# Patient Record
Sex: Male | Born: 1945 | Race: Black or African American | Hispanic: No | Marital: Single | Smoking: Current every day smoker
Health system: Southern US, Community
[De-identification: ages and names within clinical notes are randomized; demographics above are authoritative.]

## PROBLEM LIST (undated history)

## (undated) DIAGNOSIS — Z21 Asymptomatic human immunodeficiency virus [HIV] infection status: Secondary | ICD-10-CM

## (undated) DIAGNOSIS — B20 Human immunodeficiency virus [HIV] disease: Secondary | ICD-10-CM

## (undated) DIAGNOSIS — F4310 Post-traumatic stress disorder, unspecified: Secondary | ICD-10-CM

## (undated) DIAGNOSIS — E119 Type 2 diabetes mellitus without complications: Secondary | ICD-10-CM

---

## 2007-10-13 ENCOUNTER — Emergency Department (HOSPITAL_COMMUNITY): Admission: EM | Admit: 2007-10-13 | Discharge: 2007-10-14 | Payer: Self-pay | Admitting: Emergency Medicine

## 2020-11-25 ENCOUNTER — Inpatient Hospital Stay (HOSPITAL_COMMUNITY)
Admission: EM | Admit: 2020-11-25 | Discharge: 2020-12-05 | DRG: 164 | Disposition: A | Discharge status: Home / Self Care | Payer: Medicare PPO | Source: Ambulatory Visit | Attending: Internal Medicine | Admitting: Internal Medicine

## 2020-11-25 ENCOUNTER — Encounter (HOSPITAL_COMMUNITY): Payer: Self-pay | Admitting: Emergency Medicine

## 2020-11-25 ENCOUNTER — Emergency Department (HOSPITAL_COMMUNITY): Payer: Medicare PPO

## 2020-11-25 ENCOUNTER — Other Ambulatory Visit: Payer: Self-pay

## 2020-11-25 ENCOUNTER — Inpatient Hospital Stay (HOSPITAL_COMMUNITY): Payer: Medicare PPO

## 2020-11-25 DIAGNOSIS — Z20822 Contact with and (suspected) exposure to covid-19: Secondary | ICD-10-CM | POA: Diagnosis present

## 2020-11-25 DIAGNOSIS — J948 Other specified pleural conditions: Secondary | ICD-10-CM | POA: Diagnosis present

## 2020-11-25 DIAGNOSIS — Z7983 Long term (current) use of bisphosphonates: Secondary | ICD-10-CM

## 2020-11-25 DIAGNOSIS — G4700 Insomnia, unspecified: Secondary | ICD-10-CM | POA: Diagnosis present

## 2020-11-25 DIAGNOSIS — Z7984 Long term (current) use of oral hypoglycemic drugs: Secondary | ICD-10-CM

## 2020-11-25 DIAGNOSIS — J930 Spontaneous tension pneumothorax: Secondary | ICD-10-CM | POA: Diagnosis present

## 2020-11-25 DIAGNOSIS — J9382 Other air leak: Secondary | ICD-10-CM | POA: Diagnosis not present

## 2020-11-25 DIAGNOSIS — S62502A Fracture of unspecified phalanx of left thumb, initial encounter for closed fracture: Secondary | ICD-10-CM | POA: Diagnosis present

## 2020-11-25 DIAGNOSIS — N400 Enlarged prostate without lower urinary tract symptoms: Secondary | ICD-10-CM | POA: Diagnosis present

## 2020-11-25 DIAGNOSIS — J439 Emphysema, unspecified: Secondary | ICD-10-CM | POA: Diagnosis present

## 2020-11-25 DIAGNOSIS — E119 Type 2 diabetes mellitus without complications: Secondary | ICD-10-CM | POA: Diagnosis present

## 2020-11-25 DIAGNOSIS — J939 Pneumothorax, unspecified: Secondary | ICD-10-CM

## 2020-11-25 DIAGNOSIS — F39 Unspecified mood [affective] disorder: Secondary | ICD-10-CM | POA: Diagnosis present

## 2020-11-25 DIAGNOSIS — Z4682 Encounter for fitting and adjustment of non-vascular catheter: Secondary | ICD-10-CM | POA: Diagnosis not present

## 2020-11-25 DIAGNOSIS — Z7982 Long term (current) use of aspirin: Secondary | ICD-10-CM | POA: Diagnosis not present

## 2020-11-25 DIAGNOSIS — Z79899 Other long term (current) drug therapy: Secondary | ICD-10-CM

## 2020-11-25 DIAGNOSIS — B20 Human immunodeficiency virus [HIV] disease: Secondary | ICD-10-CM | POA: Diagnosis not present

## 2020-11-25 DIAGNOSIS — F17290 Nicotine dependence, other tobacco product, uncomplicated: Secondary | ICD-10-CM | POA: Diagnosis present

## 2020-11-25 DIAGNOSIS — F4310 Post-traumatic stress disorder, unspecified: Secondary | ICD-10-CM | POA: Diagnosis present

## 2020-11-25 DIAGNOSIS — Z9889 Other specified postprocedural states: Secondary | ICD-10-CM

## 2020-11-25 DIAGNOSIS — Z21 Asymptomatic human immunodeficiency virus [HIV] infection status: Secondary | ICD-10-CM | POA: Diagnosis present

## 2020-11-25 DIAGNOSIS — J9383 Other pneumothorax: Secondary | ICD-10-CM

## 2020-11-25 DIAGNOSIS — F17210 Nicotine dependence, cigarettes, uncomplicated: Secondary | ICD-10-CM | POA: Diagnosis present

## 2020-11-25 DIAGNOSIS — E0801 Diabetes mellitus due to underlying condition with hyperosmolarity with coma: Secondary | ICD-10-CM | POA: Diagnosis not present

## 2020-11-25 DIAGNOSIS — K08109 Complete loss of teeth, unspecified cause, unspecified class: Secondary | ICD-10-CM | POA: Diagnosis present

## 2020-11-25 DIAGNOSIS — X58XXXA Exposure to other specified factors, initial encounter: Secondary | ICD-10-CM | POA: Diagnosis present

## 2020-11-25 DIAGNOSIS — E871 Hypo-osmolality and hyponatremia: Secondary | ICD-10-CM | POA: Diagnosis not present

## 2020-11-25 DIAGNOSIS — Z880 Allergy status to penicillin: Secondary | ICD-10-CM

## 2020-11-25 DIAGNOSIS — N179 Acute kidney failure, unspecified: Secondary | ICD-10-CM | POA: Diagnosis not present

## 2020-11-25 DIAGNOSIS — E611 Iron deficiency: Secondary | ICD-10-CM | POA: Diagnosis present

## 2020-11-25 DIAGNOSIS — Z09 Encounter for follow-up examination after completed treatment for conditions other than malignant neoplasm: Secondary | ICD-10-CM

## 2020-11-25 DIAGNOSIS — E860 Dehydration: Secondary | ICD-10-CM | POA: Diagnosis not present

## 2020-11-25 DIAGNOSIS — Z888 Allergy status to other drugs, medicaments and biological substances status: Secondary | ICD-10-CM

## 2020-11-25 DIAGNOSIS — J9311 Primary spontaneous pneumothorax: Secondary | ICD-10-CM | POA: Diagnosis not present

## 2020-11-25 HISTORY — DX: Human immunodeficiency virus (HIV) disease: B20

## 2020-11-25 HISTORY — DX: Type 2 diabetes mellitus without complications: E11.9

## 2020-11-25 HISTORY — DX: Asymptomatic human immunodeficiency virus (hiv) infection status: Z21

## 2020-11-25 HISTORY — DX: Post-traumatic stress disorder, unspecified: F43.10

## 2020-11-25 LAB — CBC
HCT: 42.7 % (ref 39.0–52.0)
Hemoglobin: 14.2 g/dL (ref 13.0–17.0)
MCH: 29 pg (ref 26.0–34.0)
MCHC: 33.3 g/dL (ref 30.0–36.0)
MCV: 87.1 fL (ref 80.0–100.0)
Platelets: 265 10*3/uL (ref 150–400)
RBC: 4.9 MIL/uL (ref 4.22–5.81)
RDW: 15.2 % (ref 11.5–15.5)
WBC: 6.8 10*3/uL (ref 4.0–10.5)
nRBC: 0 % (ref 0.0–0.2)

## 2020-11-25 LAB — BASIC METABOLIC PANEL
Anion gap: 13 (ref 5–15)
BUN: 14 mg/dL (ref 8–23)
CO2: 23 mmol/L (ref 22–32)
Calcium: 9.4 mg/dL (ref 8.9–10.3)
Chloride: 98 mmol/L (ref 98–111)
Creatinine, Ser: 1.39 mg/dL — ABNORMAL HIGH (ref 0.61–1.24)
GFR, Estimated: 53 mL/min — ABNORMAL LOW (ref >60)
Glucose, Bld: 114 mg/dL — ABNORMAL HIGH (ref 70–99)
Potassium: 4.2 mmol/L (ref 3.5–5.1)
Sodium: 134 mmol/L — ABNORMAL LOW (ref 135–145)

## 2020-11-25 LAB — GLUCOSE, CAPILLARY: Glucose-Capillary: 119 mg/dL — ABNORMAL HIGH (ref 70–99)

## 2020-11-25 MED ORDER — ACETAMINOPHEN 650 MG RE SUPP: 650.0000 mg | Freq: Four times a day (QID) | RECTAL | Status: DC | PRN

## 2020-11-25 MED ORDER — INSULIN ASPART 100 UNIT/ML IJ SOLN
0.0000 [IU] | Freq: Three times a day (TID) | INTRAMUSCULAR | Status: DC
  Administered 2020-11-26: 2 [IU] via SUBCUTANEOUS
  Administered 2020-11-27: 1 [IU] via SUBCUTANEOUS
  Administered 2020-11-28 – 2020-11-29 (×2): 2 [IU] via SUBCUTANEOUS
  Administered 2020-11-29: 1 [IU] via SUBCUTANEOUS

## 2020-11-25 MED ORDER — LIDOCAINE HCL (PF) 1 % IJ SOLN
20.0000 mL | Freq: Once | INTRAMUSCULAR | Status: AC
  Administered 2020-11-25: 10 mL
  Filled 2020-11-25: qty 20

## 2020-11-25 MED ORDER — INSULIN ASPART 100 UNIT/ML IJ SOLN: 0.0000 [IU] | Freq: Every day | INTRAMUSCULAR | Status: DC

## 2020-11-25 MED ORDER — FENTANYL CITRATE (PF) 100 MCG/2ML IJ SOLN
100.0000 ug | Freq: Once | INTRAMUSCULAR | Status: AC
  Administered 2020-11-25: 100 ug via INTRAVENOUS
  Filled 2020-11-25: qty 2

## 2020-11-25 MED ORDER — SODIUM CHLORIDE 0.9% FLUSH
10.0000 mL | Freq: Three times a day (TID) | INTRAVENOUS | Status: DC
  Administered 2020-11-26 – 2020-11-29 (×11): 10 mL

## 2020-11-25 MED ORDER — FENTANYL CITRATE (PF) 100 MCG/2ML IJ SOLN
12.5000 ug | INTRAMUSCULAR | Status: DC | PRN
  Administered 2020-11-25 – 2020-11-27 (×5): 50 ug via INTRAVENOUS
  Filled 2020-11-25 (×5): qty 2

## 2020-11-25 MED ORDER — ACETAMINOPHEN 325 MG PO TABS
650.0000 mg | ORAL_TABLET | Freq: Four times a day (QID) | ORAL | Status: DC | PRN
  Administered 2020-11-28: 650 mg via ORAL
  Filled 2020-11-25: qty 2

## 2020-11-25 NOTE — ED Provider Notes (Signed)
Emergency Medicine Provider Triage Evaluation Note  Chris Jennings , a 75 y.o. male  was evaluated in triage.  Pt complains of SOB that began today. Had CXR at urgent care today and was told there was liquid in in right lung concerning for pneumonia. He is from out of town, here for son's wedding. No lower extremity edema or chest pain.  No fevers.  No known COVID exposures.  States he has HIV and is compliant on his medications. No known hx heart failure.  Review of Systems  Positive: SOB Negative: Fever, CP  Physical Exam  BP 133/89 (BP Location: Right Arm)   Pulse (!) 104   Temp 97.6 F (36.4 C) (Oral)   Resp 18   SpO2 97%  Gen:   Awake, no distress   Resp:  Normal effort, speaking in full sentences, lung sounds diminished right lower lung field  MSK:   Moves extremities without difficulty  Other:  No LE edema  Medical Decision Making  Medically screening exam initiated at 4:27 PM.  Appropriate orders placed.  Moises Terpstra was informed that the remainder of the evaluation will be completed by another provider, this initial triage assessment does not replace that evaluation, and the importance of remaining in the ED until their evaluation is complete.    Journee Bobrowski, [place] N, PA-C 11/25/20 1702    Arby Barrette, MD 11/30/20 [phone]

## 2020-11-25 NOTE — ED Provider Notes (Addendum)
[facility] EMERGENCY DEPARTMENT Provider Note   CSN: [id number] Arrival date & time: 11/25/20  1536     History Chief Complaint  Patient presents with   Shortness of Breath   Pneumonia    Chris Jennings is a 75 y.o. male.  Presents to ER with concern for shortness of breath.  Patient reports that his symptoms started yesterday evening.  Has been relatively constant.  Some slight discomfort on the right side of his chest.  States that difficulty breathing has been relatively constant.  States that he feels like he cannot get a deep breath.  Denies any lung history, does have history of smoking.  Denies prior history of COPD.  Denies prior history of pneumothorax.  Denies any recent trauma.  States he went to urgent care and was told that he had an abnormal chest x-ray.  On review of chart, also noted hx of HIV.  HPI     Past Medical History:  Diagnosis Date   Diabetes mellitus without complication (HCC)    HIV (human immunodeficiency virus infection) ([place])    PTSD (post-traumatic stress disorder)     Patient Active Problem List   Diagnosis Date Noted   Tension pneumothorax, spontaneous 11/25/2020   HIV (human immunodeficiency virus infection) ([place]) 11/25/2020   Diabetes ([place]) 11/25/2020    History reviewed. No pertinent surgical history.     Family History  Problem Relation Age of Onset   Cancer Mother     Social History   Tobacco Use   Smoking status: Every Day    Pack years: 0.00    Types: Cigarettes, Cigars   Smokeless tobacco: Never  Substance Use Topics   Alcohol use: Yes    Comment: Occasional   Drug use: Not Currently    Home Medications Prior to Admission medications   Medication Sig Start Date End Date Taking? Authorizing Provider  alendronate (FOSAMAX) 70 MG tablet Take 70 mg by mouth every Sunday. 10/18/20  Yes [provider]  aspirin EC 81 MG tablet Take 81 mg by mouth daily. Swallow whole.   Yes [provider]  Calcium Carb-Cholecalciferol 600-400 MG-UNIT TABS Take 1 tablet by mouth 2 (two) times daily. 05/23/16  Yes [provider]  darunavir-cobicistat (PREZCOBIX) 800-150 MG tablet Take 1 tablet by mouth daily. 10/18/20  Yes [provider]  Dolutegravir-lamiVUDine 50-300 MG TABS Take 1 tablet by mouth daily. 10/18/20  Yes [provider]  ferrous sulfate 325 (65 FE) MG tablet Take 325 mg by mouth in the morning and at bedtime. 05/23/16  Yes [provider]  finasteride (PROSCAR) 5 MG tablet Take 5 mg by mouth at bedtime. 05/23/16  Yes [provider]  ibuprofen (ADVIL) 200 MG tablet Take 600-800 mg by mouth every 6 (six) hours as needed for headache or mild pain.   Yes [provider]  metFORMIN (GLUCOPHAGE) 500 MG tablet Take 500 mg by mouth in the morning and at bedtime. 10/18/20  Yes [provider]  mirtazapine (REMERON) 7.5 MG tablet Take 7.5 mg by mouth at bedtime. 05/30/20  Yes [provider]    Allergies    Niaspan [niacin], Penicillin g, and Zocor [simvastatin]  Review of Systems   Review of Systems  Constitutional:  Negative for chills and fever.  HENT:  Negative for ear pain and sore throat.   Eyes:  Negative for pain and visual disturbance.  Respiratory:  Positive for shortness of breath. Negative for cough.   Cardiovascular:  Positive for chest pain. Negative for palpitations.  Gastrointestinal:  Negative for abdominal pain and vomiting.  Genitourinary:  Negative for dysuria and hematuria.  Musculoskeletal:  Negative for arthralgias and back pain.  Skin:  Negative for color change and rash.  Neurological:  Negative for seizures and syncope.  All other systems reviewed and are negative.  Physical Exam Updated Vital Signs BP 122/77 (BP Location: Left Arm)   Pulse 71   Temp 97.6 F (36.4 C) (Oral)   Resp 16   Ht 5' 10"  (1.778 m)   Wt 58.1 kg   SpO2 98%   BMI 18.37 kg/m   Physical  Exam Vitals and nursing note reviewed.  Constitutional:      Appearance: He is well-developed.  HENT:     Head: Normocephalic and atraumatic.  Eyes:     Conjunctiva/sclera: Conjunctivae normal.  Cardiovascular:     Rate and Rhythm: Normal rate and regular rhythm.     Heart sounds: No murmur heard. Pulmonary:     Comments: Absent breath sounds over right lung fields, some tachypnea but still speaking in full sentences Abdominal:     Palpations: Abdomen is soft.     Tenderness: There is no abdominal tenderness.  Musculoskeletal:     Cervical back: Neck supple.  Skin:    General: Skin is warm and dry.  Neurological:     Mental Status: He is alert.    ED Results / Procedures / Treatments   Labs (all labs ordered are listed, but only abnormal results are displayed) Labs Reviewed  BASIC METABOLIC PANEL - Abnormal; Notable for the following components:      Result Value   Sodium 134 (*)    Glucose, Bld 114 (*)    Creatinine, Ser 1.39 (*)    GFR, Estimated 53 (*)    All other components within normal limits  GLUCOSE, CAPILLARY - Abnormal; Notable for the following components:   Glucose-Capillary 119 (*)    All other components within normal limits  GLUCOSE, CAPILLARY - Abnormal; Notable for the following components:   Glucose-Capillary 100 (*)    All other components within normal limits  GLUCOSE, CAPILLARY - Abnormal; Notable for the following components:   Glucose-Capillary 117 (*)    All other components within normal limits  SARS CORONAVIRUS 2 (TAT 6-24 HRS)  CBC  HEMOGLOBIN A1C    EKG EKG Interpretation  Date/Time:  Saturday November 25 2020 16:07:35 EDT Ventricular Rate:  100 PR Interval:  150 QRS Duration: 68 QT Interval:  320 QTC Calculation: 412 R Axis:   90 Text Interpretation:  Suspect arm lead reversal, interpretation assumes no reversal Normal sinus rhythm Right atrial enlargement Rightward axis Pulmonary disease pattern Abnormal ECG Confirmed by Madalyn Rob [phone]) on 11/26/2020 12:28:40 PM  Radiology DG Chest 2 View  Result Date: 11/25/2020 CLINICAL DATA:  Shortness of breath.  Chest pain. EXAM: CHEST - 2 VIEW COMPARISON:  None. FINDINGS: There is a moderate to large right-sided pneumothorax. The pneumothorax measures at least 8.4 cm at the base and 4.8 cm at the apex. There is fluid in the right pleural space as well consistent with a hydropneumothorax. The relatively collapsed right lung is not otherwise well assessed. There is some shifting of the mediastinum to the left consistent with a tension pneumothora on the right. No pneumothorax on the left. No infiltrate on the left. No other acute abnormalities. Anterior wedging of a 2 lower thoracic vertebral bodies is age indeterminate. IMPRESSION: 1. Right-sided tension  pneumothorax. Fluid in the right pleural space. 2. Age-indeterminate anterior wedging of 2 lower thoracic vertebral bodies. Findings called to Dr. Johnney Killian. Electronically Signed   By: Dorise Bullion III M.D   On: 11/25/2020 17:14   DG Chest Portable 1 View  Result Date: 11/25/2020 CLINICAL DATA:  Status post chest tube placement. EXAM: PORTABLE CHEST 1 VIEW COMPARISON:  November 25, 2020 FINDINGS: A right-sided chest tube is seen with its distal end overlying the lateral aspect of the mid right lung. A small residual right apical pneumothorax is seen. A small right pleural effusion is also noted. Mild right infrahilar atelectasis and/or infiltrate is present. The heart size and mediastinal contours are within normal limits. Multiple chronic left-sided rib fractures are seen. IMPRESSION: Interval right-sided chest tube placement and positioning, as described above, with a small residual right apical pneumothorax. Electronically Signed   By: Virgina Norfolk M.D.   On: 11/25/2020 22:29    Procedures CHEST TUBE INSERTION  Date/Time: 11/26/2020 12:17 PM Performed by: Lucrezia Starch, MD Authorized by: Lucrezia Starch, MD    Consent:    Consent obtained:  Written and verbal   Consent given by:  Patient   Risks, benefits, and alternatives were discussed: yes     Risks discussed:  Bleeding, incomplete drainage, infection, damage to surrounding structures, pain and nerve damage   Alternatives discussed:  No treatment, delayed treatment, alternative treatment, observation and referral Universal protocol:    Procedure explained and questions answered to patient or proxy's satisfaction: yes     Relevant documents present and verified: yes     Test results available: yes     Imaging studies available: yes     Site/side marked: yes     Immediately prior to procedure, a time out was called: yes     Patient identity confirmed:  Verbally with patient and arm band Pre-procedure details:    Skin preparation:  Chlorhexidine   Preparation: Patient was prepped and draped in the usual sterile fashion   Sedation:    Sedation type:  None Anesthesia:    Anesthesia method:  Local infiltration   Local anesthetic:  Lidocaine 1% w/o epi Procedure details:    Placement location:  R lateral   Scalpel size:  11   Tube size ([place]): 14.   Ultrasound guidance: no     Drainage characteristics:  Air only   Suture material:  2-0 silk   Dressing:  Xeroform gauze and 4x4 sterile gauze Post-procedure details:    Post-insertion x-ray findings: tube in good position     Procedure completion:  Tolerated well, no immediate complications Comments:     On right chest wall, site marked at approximately fifth intercostal space at the midaxillary line, nipple line.  Patient in bed at 45 degrees.  Timeout called.  Utilized [place] pneumothorax kit.  Prepped with chlorhexidine.  Applied sterile drape.  Injected 10 mL 1% lidocaine without epinephrine.  Inserted needle at site marking and felt a rush of air in syringe.  Inserted guidewire, removed needle, made small incision with scalpel at level of skin, then inserted and removed dilator over  guidewire, then inserted pigtail catheter over the guidewire.  Subsequently removed guidewire.  Sutured in place.  Applied sterile Xeroform gauze and 4 x 4 sterile gauze.  Apply tape.  Connected chest tube to Pleur-evac atrium, set to -20 cm. .Critical Care  Date/Time: 12/12/2020 12:07 AM Performed by: Lucrezia Starch, MD Authorized by: Lucrezia Starch, MD   Critical  care provider statement:    Critical care time (minutes):  42   Critical care was necessary to treat or prevent imminent or life-threatening deterioration of the following conditions:  Respiratory failure   Critical care was time spent personally by me on the following activities:  Discussions with consultants, evaluation of patient's response to treatment, examination of patient, ordering and performing treatments and interventions, ordering and review of laboratory studies, ordering and review of radiographic studies, pulse oximetry, re-evaluation of patient's condition, obtaining history from patient or surrogate and review of old charts   Medications Ordered in ED Medications  sodium chloride flush (NS) 0.9 % injection 10 mL (10 mLs Intracatheter Given 11/26/20 1018)  acetaminophen (TYLENOL) tablet 650 mg (has no administration in time range)    Or  acetaminophen (TYLENOL) suppository 650 mg (has no administration in time range)  fentaNYL (SUBLIMAZE) injection 12.5-50 mcg (50 mcg Intravenous Given 11/26/20 1023)  insulin aspart (novoLOG) injection 0-9 Units (0 Units Subcutaneous Not Given 11/26/20 1200)  insulin aspart (novoLOG) injection 0-5 Units (0 Units Subcutaneous Not Given 11/26/20 0036)  darunavir-cobicistat (PREZCOBIX) 800-150 MG per tablet 1 tablet (1 tablet Oral Given 11/26/20 1201)  mirtazapine (REMERON) tablet 7.5 mg (has no administration in time range)  finasteride (PROSCAR) tablet 5 mg (has no administration in time range)  ferrous sulfate tablet 325 mg (has no administration in time range)  dolutegravir  (TIVICAY) tablet 50 mg (50 mg Oral Given 11/26/20 1017)    And  lamiVUDine (EPIVIR) tablet 300 mg (300 mg Oral Given 11/26/20 1017)  calcium carbonate (OS-CAL - dosed in mg of elemental calcium) tablet 500 mg of elemental calcium (has no administration in time range)  cholecalciferol (VITAMIN D3) tablet 400 Units (400 Units Oral Given 11/26/20 1017)  lidocaine (PF) (XYLOCAINE) 1 % injection 20 mL (10 mLs Infiltration Given 11/25/20 1842)  fentaNYL (SUBLIMAZE) injection 100 mcg (100 mcg Intravenous Given 11/25/20 1835)    ED Course  I have reviewed the triage vital signs and the nursing notes.  Pertinent labs & imaging results that were available during my care of the patient were reviewed by me and considered in my medical decision making (see chart for details).    MDM Rules/Calculators/A&P                          75 year old male with history of smoking, HIV presents to ER with concern for shortness of breath.  Chest x-ray concerning for large right-sided pneumothorax.  Suspect spontaneous.  His vital signs were stable on room air though noted to have tachypnea, did not actually require any oxygen.  Upon review of chest x-ray, placed on supplemental O2, discussed case with cardiothoracic surgery on-call, Dr. Cyndia Bent.  He recommends pigtail chest tube placement in ER, admission to medicine service, their team will follow along to manage chest tube.  Placed 14 French pigtail catheter.  Patient tolerated very well.  Chest x-ray demonstrating significant improvement in pneumothorax with small residual pneumothorax.  Discussed with Dr. Marlowe Sax who will admit primary.  Final Clinical Impression(s) / ED Diagnoses Final diagnoses:  Spontaneous pneumothorax  Admission for chest tube placement    Rx / DC Orders ED Discharge Orders     None        Lucrezia Starch, MD 11/26/20 1229    Lucrezia Starch, MD 12/12/20 [phone]

## 2020-11-25 NOTE — H&P (Signed)
History and Physical    Chris Jennings UJW:[id number] DOB: 1946/02/14 DOA: 11/25/2020  PCP: [facility] Patient coming from: Home  Chief Complaint: Shortness of breath  HPI: Chris Jennings is a 75 y.o. male with medical history significant of diabetes, HIV, PTSD presented to the ED with a chief complaint of shortness of breath which started today.  In the ED, he was tachypneic but not hypoxic.  Placed on 2 L supplemental oxygen for comfort.  Labs showing WBC 6.8, hemoglobin 14.2, platelet count 265K.  Sodium 134, potassium 4.2, chloride 98, bicarb 23, BUN 14, creatinine 1.3, glucose 114.  SARS-CoV-2 PCR test pending.  Chest x-ray showing moderate to large right-sided tension pneumothorax with fluid in the right pleural space as well consistent with a hydropneumothorax.  Chest tube was placed.  ED physician discussed the case with Dr. Laneta Simmers from cardiothoracic surgery who requested admission under hospitalist service, will consult.  Patient states he is from [place] and currently in town for his grandson's wedding.  Last night at the party he was drinking beer and felt like it went down his windpipe.  Soon after he started feeling short of breath.  Today he went to urgent care and they did a chest x-ray and told him he had fluid in his lung and advised him to come into the emergency room to be evaluated.  States he is feeling much better now after the chest tube was placed.  Denies history of pneumothorax.  He used to smoke cigarettes but has cut down significantly and not smoking much anymore.  States his only other medical problems are HIV and diabetes.  He takes medications for HIV and states his last viral load was undetectable.  States he used to take insulin for diabetes in the past but currently only on metformin.  Denies any other medical problems.  Review of Systems:  All systems reviewed and apart from history of presenting illness, are negative.  Past Medical History:   Diagnosis Date   Diabetes mellitus without complication (HCC)    HIV (human immunodeficiency virus infection) (HCC)    PTSD (post-traumatic stress disorder)     History reviewed. No pertinent surgical history.   reports that he has been smoking cigarettes and cigars. He has never used smokeless tobacco. He reports current alcohol use. He reports previous drug use.  Not on File  Family History  Problem Relation Age of Onset   Cancer Mother     Prior to Admission medications   Not on File    Physical Exam: Vitals:   11/25/20 1733 11/25/20 1745 11/25/20 1830 11/25/20 1900  BP: (!) 165/109 (!) 153/92 (!) 153/94 134/85  Pulse: 93 87 91 91  Resp: (!) 33 (!) 27 (!) 33 (!) 26  Temp:      TempSrc:      SpO2: 96% 95% 96% 96%    Physical Exam Constitutional:      General: He is not in acute distress. HENT:     Head: Normocephalic and atraumatic.  Eyes:     Extraocular Movements: Extraocular movements intact.     Conjunctiva/sclera: Conjunctivae normal.  Cardiovascular:     Rate and Rhythm: Normal rate and regular rhythm.     Pulses: Normal pulses.  Pulmonary:     Effort: Pulmonary effort is normal. No respiratory distress.     Breath sounds: No wheezing or rales.     Comments: Breath sounds appreciated in bilateral lung fields Abdominal:     General: Bowel sounds  are normal. There is no distension.     Palpations: Abdomen is soft.     Tenderness: There is no abdominal tenderness.  Musculoskeletal:        General: No swelling or tenderness.     Cervical back: Normal range of motion and neck supple.  Skin:    General: Skin is warm and dry.  Neurological:     General: No focal deficit present.     Mental Status: He is alert and oriented to person, place, and time.     Labs on Admission: I have personally reviewed following labs and imaging studies  CBC: Recent Labs  Lab 11/25/20 1624  WBC 6.8  HGB 14.2  HCT 42.7  MCV 87.1  PLT 265   Basic Metabolic  Panel: Recent Labs  Lab 11/25/20 1624  NA 134*  K 4.2  CL 98  CO2 23  GLUCOSE 114*  BUN 14  CREATININE 1.39*  CALCIUM 9.4   GFR: CrCl cannot be calculated (Unknown ideal weight.). Liver Function Tests: No results for input(s): AST, ALT, ALKPHOS, BILITOT, PROT, ALBUMIN in the last 168 hours. No results for input(s): LIPASE, AMYLASE in the last 168 hours. No results for input(s): AMMONIA in the last 168 hours. Coagulation Profile: No results for input(s): INR, PROTIME in the last 168 hours. Cardiac Enzymes: No results for input(s): CKTOTAL, CKMB, CKMBINDEX, TROPONINI in the last 168 hours. BNP (last 3 results) No results for input(s): PROBNP in the last 8760 hours. HbA1C: No results for input(s): HGBA1C in the last 72 hours. CBG: No results for input(s): GLUCAP in the last 168 hours. Lipid Profile: No results for input(s): CHOL, HDL, LDLCALC, TRIG, CHOLHDL, LDLDIRECT in the last 72 hours. Thyroid Function Tests: No results for input(s): TSH, T4TOTAL, FREET4, T3FREE, THYROIDAB in the last 72 hours. Anemia Panel: No results for input(s): VITAMINB12, FOLATE, FERRITIN, TIBC, IRON, RETICCTPCT in the last 72 hours. Urine analysis: No results found for: COLORURINE, APPEARANCEUR, LABSPEC, PHURINE, GLUCOSEU, HGBUR, BILIRUBINUR, KETONESUR, PROTEINUR, UROBILINOGEN, NITRITE, LEUKOCYTESUR  Radiological Exams on Admission: DG Chest 2 View  Result Date: 11/25/2020 CLINICAL DATA:  Shortness of breath.  Chest pain. EXAM: CHEST - 2 VIEW COMPARISON:  None. FINDINGS: There is a moderate to large right-sided pneumothorax. The pneumothorax measures at least 8.4 cm at the base and 4.8 cm at the apex. There is fluid in the right pleural space as well consistent with a hydropneumothorax. The relatively collapsed right lung is not otherwise well assessed. There is some shifting of the mediastinum to the left consistent with a tension pneumothora on the right. No pneumothorax on the left. No infiltrate  on the left. No other acute abnormalities. Anterior wedging of a 2 lower thoracic vertebral bodies is age indeterminate. IMPRESSION: 1. Right-sided tension pneumothorax. Fluid in the right pleural space. 2. Age-indeterminate anterior wedging of 2 lower thoracic vertebral bodies. Findings called to Dr. Donnald Garre. Electronically Signed   By: Gerome Sam III M.D   On: 11/25/2020 17:14    EKG: Independently reviewed.  Sinus rhythm, no prior tracing for comparison.  Assessment/Plan Principal Problem:   Tension pneumothorax, spontaneous Active Problems:   HIV (human immunodeficiency virus infection) (HCC)   Diabetes (HCC)   Tension pneumothorax, spontaneous Chest x-ray showing moderate to large right-sided tension pneumothorax with fluid in the right pleural space as well consistent with a hydropneumothorax.  Risk factors include history of cigarette smoking. -Chest tube placed in the ED and currently hemodynamically stable. Cardiothoracic surgery will consult.  Continue pain  management. Continue supplemental oxygen and monitor hemodynamics very closely.  HIV -Resume home meds after pharmacy med rec is done.  Type II diabetes -Check A1c.  Order sliding scale insulin sensitive ACHS.   DVT prophylaxis: SCDs Code Status: Patient wishes to be full code. Family Communication: No family available at this time. Disposition Plan: Status is: Inpatient  Remains inpatient appropriate because:Inpatient level of care appropriate due to severity of illness  Dispo: The patient is from: Home              Anticipated d/c is to: Home              Patient currently is not medically stable to d/c.   Difficult to place patient No  Consults called: Cardiothoracic surgery Level of care: Level of care: Progressive  The medical decision making on this patient was of high complexity and the patient is at high risk for clinical deterioration, therefore this is a level 3 visit.  John Giovanni MD [facility]  If 7PM-7AM, please contact night-coverage [email or web address]  11/25/2020, 8:00 PM

## 2020-11-25 NOTE — ED Notes (Signed)
Lab to add A1c 

## 2020-11-25 NOTE — ED Triage Notes (Addendum)
Pt reports SOB since last night.  States he had a chest x-ray today and was told he had pneumonia.  Denies chest pain.  Pt is from [place].

## 2020-11-26 ENCOUNTER — Inpatient Hospital Stay (HOSPITAL_COMMUNITY): Payer: Medicare PPO

## 2020-11-26 LAB — SARS CORONAVIRUS 2 (TAT 6-24 HRS): SARS Coronavirus 2: NEGATIVE

## 2020-11-26 LAB — GLUCOSE, CAPILLARY
Glucose-Capillary: 100 mg/dL — ABNORMAL HIGH (ref 70–99)
Glucose-Capillary: 117 mg/dL — ABNORMAL HIGH (ref 70–99)
Glucose-Capillary: 171 mg/dL — ABNORMAL HIGH (ref 70–99)
Glucose-Capillary: 117 mg/dL — ABNORMAL HIGH (ref 70–99)

## 2020-11-26 MED ORDER — CALCIUM CARB-CHOLECALCIFEROL 600-400 MG-UNIT PO TABS: 1.0000 | ORAL_TABLET | Freq: Two times a day (BID) | ORAL | Status: DC

## 2020-11-26 MED ORDER — CALCIUM CARBONATE 1250 (500 CA) MG PO TABS: 1.0000 | ORAL_TABLET | Freq: Every day | ORAL | Status: DC

## 2020-11-26 MED ORDER — DOLUTEGRAVIR SODIUM 50 MG PO TABS
50.0000 mg | ORAL_TABLET | Freq: Every day | ORAL | Status: DC
  Administered 2020-11-26 – 2020-11-29 (×4): 50 mg via ORAL
  Filled 2020-11-26 (×5): qty 1

## 2020-11-26 MED ORDER — CHOLECALCIFEROL 10 MCG (400 UNIT) PO TABS
400.0000 [IU] | ORAL_TABLET | Freq: Every day | ORAL | Status: DC
  Administered 2020-11-26 – 2020-11-29 (×4): 400 [IU] via ORAL
  Filled 2020-11-26 (×5): qty 1

## 2020-11-26 MED ORDER — FINASTERIDE 5 MG PO TABS
5.0000 mg | ORAL_TABLET | Freq: Every day | ORAL | Status: DC
  Administered 2020-11-26 – 2020-11-29 (×4): 5 mg via ORAL
  Filled 2020-11-26 (×4): qty 1

## 2020-11-26 MED ORDER — MIRTAZAPINE 15 MG PO TABS
7.5000 mg | ORAL_TABLET | Freq: Every day | ORAL | Status: DC
  Administered 2020-11-26 – 2020-11-29 (×4): 7.5 mg via ORAL
  Filled 2020-11-26 (×4): qty 1

## 2020-11-26 MED ORDER — LAMIVUDINE 150 MG PO TABS
300.0000 mg | ORAL_TABLET | Freq: Every day | ORAL | Status: DC
  Administered 2020-11-26 – 2020-11-29 (×4): 300 mg via ORAL
  Filled 2020-11-26 (×5): qty 2

## 2020-11-26 MED ORDER — FERROUS SULFATE 325 (65 FE) MG PO TABS
325.0000 mg | ORAL_TABLET | Freq: Two times a day (BID) | ORAL | Status: DC
  Administered 2020-11-26 – 2020-11-29 (×7): 325 mg via ORAL
  Filled 2020-11-26 (×7): qty 1

## 2020-11-26 MED ORDER — DARUNAVIR-COBICISTAT 800-150 MG PO TABS
1.0000 | ORAL_TABLET | Freq: Every day | ORAL | Status: DC
  Administered 2020-11-26 – 2020-11-29 (×4): 1 via ORAL
  Filled 2020-11-26 (×5): qty 1

## 2020-11-26 MED ORDER — DOLUTEGRAVIR-LAMIVUDINE 50-300 MG PO TABS: 1.0000 | ORAL_TABLET | Freq: Every day | ORAL | Status: DC

## 2020-11-26 NOTE — Progress Notes (Signed)
PROGRESS NOTE    Chris Jennings   NTZ:[id number]  DOB: 10-07-1945  PCP: [facility]    DOA: 11/25/2020 LOS: 1   Assessment & Plan   Principal Problem:   Tension pneumothorax, spontaneous Active Problems:   HIV (human immunodeficiency virus infection) (HCC)   Diabetes (HCC)   Right-sided Tension pneumothorax / Hydrothorax, spontaneous Chest x-ray showing moderate to large right-sided tension pneumothorax with fluid in the right pleural space as well consistent with a hydropneumothorax.  Risk factors include history of cigarette smoking. --Chest tube placed in the ED  --Pt remains stable --Cardiothoracic surgery consulted on admission --Pain control PRN --Monitor closely --Serial CXR's    HIV - Resumed home meds    Type II diabetes - sliding scale Novolog.  A1c pending.  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   DVT prophylaxis: SCDs Start: 11/25/20 1941   Diet:  Diet Orders (From admission, onward)     Start     Ordered   11/25/20 1942  Diet Carb Modified Fluid consistency: Thin; Room service appropriate? Yes  Diet effective now       Question Answer Comment  Diet-HS Snack? Nothing   Calorie Level Medium 1600-2000   Fluid consistency: Thin   Room service appropriate? Yes      11/25/20 1942              Code Status: Full Code   Brief Narrative / Hospital Course to Date:   Chris Jennings is a 75 y.o. male with medical history significant of diabetes, HIV, PTSD presented to the ED with a chief complaint of sudden onset shortness of breath.  He was found to have right-sided tension PTX / hydrothorax.  Chest tube was placed in the ED.  CTS was consulted, requested [facility] admission and they will consult.    6/26 Patient hemodynamically stable and on room air.  Subjective 11/26/20    Pt reports pain in his right side at  chest tube site.  Has coarse productive cough at times.  No fever/chills.  Denies other acute complaints.  Denies hx of COPD / emphysema.  Patient's fiance in to visit w/patient.   Disposition Plan & Communication   Status is: Inpatient  Remains inpatient appropriate because:Inpatient level of care appropriate due to severity of illness  Dispo: The patient is from: Home              Anticipated d/c is to: Home              Patient currently is not medically stable to d/c.   Difficult to place patient No   Family Communication: fiance at bedside on rounds    Consults, Procedures, Significant Events   Consultants:  CTS  Procedures:  Chest tube placed, right - in ED on admission  Antimicrobials:  Anti-infectives (From admission, onward)    Start     Dose/Rate Route Frequency Ordered Stop   11/26/20 1200  darunavir-cobicistat (PREZCOBIX) 800-150 MG per tablet 1 tablet        1 tablet Oral Daily with lunch 11/26/20 0856     11/26/20 1000  Dolutegravir-lamiVUDine 50-300 MG TABS 1 tablet  Status:  Discontinued        1 tablet Oral Daily 11/26/20 0856 11/26/20 0907   11/26/20 1000  dolutegravir (TIVICAY) tablet 50 mg  See Hyperspace for full Linked Orders Report.   50 mg Oral Daily 11/26/20 0910     11/26/20 1000  lamiVUDine (EPIVIR) tablet 300 mg       See Hyperspace for full Linked Orders Report.   300 mg Oral Daily 11/26/20 0910           Micro    Objective   Vitals:   11/25/20 2154 11/25/20 2200 11/26/20 0729 11/26/20 1153  BP:   121/83 122/77  Pulse:  80 74 71  Resp:   15 16  Temp:   (!) 97.5 F (36.4 C) 97.6 F (36.4 C)  TempSrc:   Oral Oral  SpO2:  98% 96% 98%  Weight: 58.1 kg     Height:        Intake/Output Summary (Last 24 hours) at 11/26/2020 1615 Last data filed at 11/26/2020 1200 Gross per 24 hour  Intake 250 ml  Output 238 ml  Net 12 ml   Filed Weights   11/25/20 2154  Weight: 58.1 kg    Physical Exam:  General exam: awake, alert,  no acute distress Respiratory system: diminished Right, wet-sounding cough intermittently, Left clear, no wheezes, normal respiratory effort, on room air. Cardiovascular system: normal S1/S2, RRR, no pedal edema.   Gastrointestinal system: soft, NT, ND, no HSM felt, +bowel sounds. Central nervous system: A&O x3. no gross focal neurologic deficits, normal speech Extremities: moves all, no edema, normal tone Psychiatry: normal mood, congruent affect, judgement and insight appear normal  Labs   Data Reviewed: I have personally reviewed following labs and imaging studies  CBC: Recent Labs  Lab 11/25/20 1624  WBC 6.8  HGB 14.2  HCT 42.7  MCV 87.1  PLT 265   Basic Metabolic Panel: Recent Labs  Lab 11/25/20 1624  NA 134*  K 4.2  CL 98  CO2 23  GLUCOSE 114*  BUN 14  CREATININE 1.39*  CALCIUM 9.4   GFR: Estimated Creatinine Clearance: 38.3 mL/min (A) (by C-G formula based on SCr of 1.39 mg/dL (H)). Liver Function Tests: No results for input(s): AST, ALT, ALKPHOS, BILITOT, PROT, ALBUMIN in the last 168 hours. No results for input(s): LIPASE, AMYLASE in the last 168 hours. No results for input(s): AMMONIA in the last 168 hours. Coagulation Profile: No results for input(s): INR, PROTIME in the last 168 hours. Cardiac Enzymes: No results for input(s): CKTOTAL, CKMB, CKMBINDEX, TROPONINI in the last 168 hours. BNP (last 3 results) No results for input(s): PROBNP in the last 8760 hours. HbA1C: No results for input(s): HGBA1C in the last 72 hours. CBG: Recent Labs  Lab 11/25/20 2300 11/26/20 0726 11/26/20 1150 11/26/20 1613  GLUCAP 119* 100* 117* 171*   Lipid Profile: No results for input(s): CHOL, HDL, LDLCALC, TRIG, CHOLHDL, LDLDIRECT in the last 72 hours. Thyroid Function Tests: No results for input(s): TSH, T4TOTAL, FREET4, T3FREE, THYROIDAB in the last 72 hours. Anemia Panel: No results for input(s): VITAMINB12, FOLATE, FERRITIN, TIBC, IRON, RETICCTPCT in the last  72 hours. Sepsis Labs: No results for input(s): PROCALCITON, LATICACIDVEN in the last 168 hours.  Recent Results (from the past 240 hour(s))  SARS CORONAVIRUS 2 (TAT 6-24 HRS) Nasopharyngeal Nasopharyngeal Swab     Status: None   Collection Time: 11/25/20  7:06 PM   Specimen: Nasopharyngeal Swab  Result Value Ref Range Status   SARS Coronavirus 2 NEGATIVE NEGATIVE Final    Comment: (NOTE) SARS-CoV-2 target nucleic acids are NOT DETECTED.  The SARS-CoV-2 RNA is generally detectable in upper and  lower respiratory specimens during the acute phase of infection. Negative results do not preclude SARS-CoV-2 infection, do not rule out co-infections with other pathogens, and should not be used as the sole basis for treatment or other patient management decisions. Negative results must be combined with clinical observations, patient history, and epidemiological information. The expected result is Negative.  Fact Sheet for Patients: HairSlick.no  Fact Sheet for Healthcare Providers: quierodirigir.com  This test is not yet approved or cleared by the [place] FDA and  has been authorized for detection and/or diagnosis of SARS-CoV-2 by FDA under an Emergency Use Authorization (EUA). This EUA will remain  in effect (meaning this test can be used) for the duration of the COVID-19 declaration under Se ction 564(b)(1) of the Act, 21 U.S.C. section 360bbb-3(b)(1), unless the authorization is terminated or revoked sooner.  Performed at [facility], [address] [place], [place] [address]       Imaging Studies   DG Chest 2 View  Result Date: 11/25/2020 CLINICAL DATA:  Shortness of breath.  Chest pain. EXAM: CHEST - 2 VIEW COMPARISON:  None. FINDINGS: There is a moderate to large right-sided pneumothorax. The pneumothorax measures at least 8.4 cm at the base and 4.8 cm at the apex. There is fluid in the right pleural space as  well consistent with a hydropneumothorax. The relatively collapsed right lung is not otherwise well assessed. There is some shifting of the mediastinum to the left consistent with a tension pneumothora on the right. No pneumothorax on the left. No infiltrate on the left. No other acute abnormalities. Anterior wedging of a 2 lower thoracic vertebral bodies is age indeterminate. IMPRESSION: 1. Right-sided tension pneumothorax. Fluid in the right pleural space. 2. Age-indeterminate anterior wedging of 2 lower thoracic vertebral bodies. Findings called to Dr. Donnald Garre. Electronically Signed   By: Gerome Sam III M.D   On: 11/25/2020 17:14   DG Chest Portable 1 View  Result Date: 11/25/2020 CLINICAL DATA:  Status post chest tube placement. EXAM: PORTABLE CHEST 1 VIEW COMPARISON:  November 25, 2020 FINDINGS: A right-sided chest tube is seen with its distal end overlying the lateral aspect of the mid right lung. A small residual right apical pneumothorax is seen. A small right pleural effusion is also noted. Mild right infrahilar atelectasis and/or infiltrate is present. The heart size and mediastinal contours are within normal limits. Multiple chronic left-sided rib fractures are seen. IMPRESSION: Interval right-sided chest tube placement and positioning, as described above, with a small residual right apical pneumothorax. Electronically Signed   By: Aram Candela M.D.   On: 11/25/2020 22:29     Medications   Scheduled Meds:  [START ON 11/27/2020] calcium carbonate  1 tablet Oral Q breakfast   cholecalciferol  400 Units Oral Daily   darunavir-cobicistat  1 tablet Oral Q lunch   dolutegravir  50 mg Oral Daily   And   lamiVUDine  300 mg Oral Daily   ferrous sulfate  325 mg Oral BID WC   finasteride  5 mg Oral QHS   insulin aspart  0-5 Units Subcutaneous QHS   insulin aspart  0-9 Units Subcutaneous TID WC   mirtazapine  7.5 mg Oral QHS   sodium chloride flush  10 mL Intracatheter Q8H   Continuous  Infusions:     LOS: 1 day    Time spent: 30 minutes    Pennie Banter, DO [facility]  11/26/2020, 4:15 PM      If 7PM-7AM,  please contact night-coverage. How to contact the [facility] Attending or Consulting provider 7A - 7P or covering provider during after hours 7P -7A, for this patient?    Check the care team in [facility] and look for a) attending/consulting [facility] provider listed and b) the [facility] team listed Log into [email or web address] and use Dewy Rose's universal password to access. If you do not have the password, please contact the hospital operator. Locate the [facility] provider you are looking for under [facility] and page to a number that you can be directly reached. If you still have difficulty reaching the provider, please page the [facility] (Director on Call) for the Hospitalists listed on amion for assistance.

## 2020-11-27 ENCOUNTER — Inpatient Hospital Stay (HOSPITAL_COMMUNITY): Payer: Medicare PPO

## 2020-11-27 DIAGNOSIS — B20 Human immunodeficiency virus [HIV] disease: Secondary | ICD-10-CM

## 2020-11-27 DIAGNOSIS — E119 Type 2 diabetes mellitus without complications: Secondary | ICD-10-CM

## 2020-11-27 DIAGNOSIS — J9311 Primary spontaneous pneumothorax: Secondary | ICD-10-CM

## 2020-11-27 LAB — HEMOGLOBIN A1C
Hgb A1c MFr Bld: 5.7 % — ABNORMAL HIGH (ref 4.8–5.6)
Mean Plasma Glucose: 117 mg/dL

## 2020-11-27 LAB — GLUCOSE, CAPILLARY
Glucose-Capillary: 99 mg/dL (ref 70–99)
Glucose-Capillary: 145 mg/dL — ABNORMAL HIGH (ref 70–99)
Glucose-Capillary: 99 mg/dL (ref 70–99)
Glucose-Capillary: 132 mg/dL — ABNORMAL HIGH (ref 70–99)

## 2020-11-27 MED ORDER — CALCIUM CARBONATE 1250 (500 CA) MG PO TABS
1.0000 | ORAL_TABLET | Freq: Every day | ORAL | Status: DC
  Administered 2020-11-27 – 2020-11-29 (×3): 500 mg via ORAL
  Filled 2020-11-27 (×3): qty 1

## 2020-11-27 NOTE — Progress Notes (Signed)
Chris Jennings  GYB:[id number] DOB: 29-Aug-1945 DOA: 11/25/2020 PCP: [facility]    Brief Narrative:  75 year old with a history of DM, HIV, and PTSD who presented to the ED with the sudden onset of severe shortness of breath.  Evaluation revealed a right-sided tension pneumothorax.  A chest tube was placed in the ED.  Significant Events:  6/25 admit via ED  Consultants:  TCTS  Code Status: FULL CODE  Antimicrobials:  None  DVT prophylaxis: Lovenox  Subjective: Afebrile.  Vital signs stable.  Saturation 96% on 2 L nasal cannula.  States that he is breathing comfortably.  Having expected pain at chest tube insertion site.  Denies fevers chills nausea or vomiting.  No abdominal pain.  Assessment & Plan:  Right spontaneous tension pneumothorax Chest tube placed in ED - TCTS to consult for tube management -appears stable at present  HIV Continue usual home medications  DM2 CBG well controlled at this time  BPH Continue usual Proscar -asymptomatic presently   Family Communication: No family present at time of exam Status is: Inpatient  Remains inpatient appropriate because:Inpatient level of care appropriate due to severity of illness  Dispo: The patient is from: Home              Anticipated d/c is to: Home              Patient currently is not medically stable to d/c.   Difficult to place patient No    Objective: Blood pressure 120/87, pulse 90, temperature 98.5 F (36.9 C), temperature source Oral, resp. rate 18, height 5\' 10"  (1.778 m), weight 58.1 kg, SpO2 95 %.  Intake/Output Summary (Last 24 hours) at 11/27/2020 1030 Last data filed at 11/27/2020 0900 Gross per 24 hour  Intake 920 ml  Output 482 ml  Net 438 ml   Filed Weights   11/25/20 2154  Weight: 58.1 kg    Examination: General: No acute respiratory distress Lungs: Clear to auscultation bilaterally without wheezes or crackles Cardiovascular: Regular rate and rhythm without murmur  gallop or rub normal S1 and S2 Abdomen: Nontender, nondistended, soft, bowel sounds positive, no rebound, no ascites, no appreciable mass Extremities: No significant cyanosis, clubbing, or edema bilateral lower extremities  CBC: Recent Labs  Lab 11/25/20 1624  WBC 6.8  HGB 14.2  HCT 42.7  MCV 87.1  PLT 265   Basic Metabolic Panel: Recent Labs  Lab 11/25/20 1624  NA 134*  K 4.2  CL 98  CO2 23  GLUCOSE 114*  BUN 14  CREATININE 1.39*  CALCIUM 9.4   GFR: Estimated Creatinine Clearance: 38.3 mL/min (A) (by C-G formula based on SCr of 1.39 mg/dL (H)).   CBG: Recent Labs  Lab 11/26/20 0726 11/26/20 1150 11/26/20 1613 11/26/20 2045 11/27/20 0812  GLUCAP 100* 117* 171* 117* 99    Recent Results (from the past 240 hour(s))  SARS CORONAVIRUS 2 (TAT 6-24 HRS) Nasopharyngeal Nasopharyngeal Swab     Status: None   Collection Time: 11/25/20  7:06 PM   Specimen: Nasopharyngeal Swab  Result Value Ref Range Status   SARS Coronavirus 2 NEGATIVE NEGATIVE Final    Comment: (NOTE) SARS-CoV-2 target nucleic acids are NOT DETECTED.  The SARS-CoV-2 RNA is generally detectable in upper and lower respiratory specimens during the acute phase of infection. Negative results do not preclude SARS-CoV-2 infection, do not rule out co-infections with other pathogens, and should not be used as the sole basis for treatment or other patient management decisions. Negative  results must be combined with clinical observations, patient history, and epidemiological information. The expected result is Negative.  Fact Sheet for Patients: HairSlick.no  Fact Sheet for Healthcare Providers: quierodirigir.com  This test is not yet approved or cleared by the [place] FDA and  has been authorized for detection and/or diagnosis of SARS-CoV-2 by FDA under an Emergency Use Authorization (EUA). This EUA will remain  in effect (meaning this test  can be used) for the duration of the COVID-19 declaration under Se ction 564(b)(1) of the Act, 21 U.S.C. section 360bbb-3(b)(1), unless the authorization is terminated or revoked sooner.  Performed at [facility], [address], [place], [place] [address]      Scheduled Meds:  calcium carbonate  1 tablet Oral Q lunch   cholecalciferol  400 Units Oral Daily   darunavir-cobicistat  1 tablet Oral Q lunch   dolutegravir  50 mg Oral Daily   And   lamiVUDine  300 mg Oral Daily   ferrous sulfate  325 mg Oral BID WC   finasteride  5 mg Oral QHS   insulin aspart  0-5 Units Subcutaneous QHS   insulin aspart  0-9 Units Subcutaneous TID WC   mirtazapine  7.5 mg Oral QHS   sodium chloride flush  10 mL Intracatheter Q8H      LOS: 2 days   Lonia Blood, MD [facility] Office  [phone] Pager - Text Page per Loretha Stapler  If 7PM-7AM, please contact night-coverage per Amion 11/27/2020, 10:30 AM

## 2020-11-27 NOTE — Consult Note (Signed)
Reason for Consult:Pneumothorax Referring Physician: [facility]  Chris Jennings is an 75 y.o. male.  HPI: 75 yo man presented with SOB  Chris Jennings is a 75 yo man with a history of tobacco abuse, type II diabetes, HIV and PTSD. He presented to ED yesterday with acute onset of shortness of breath. He had choked on beer the night before and had a coughing spell. Felt short of breath which worsened overnight.  In ED found to have a large right pneumothorax. A pigtail catheter was placed and his breathing improved.  Past Medical History:  Diagnosis Date   Diabetes mellitus without complication (HCC)    HIV (human immunodeficiency virus infection) (HCC)    PTSD (post-traumatic stress disorder)     History reviewed. No pertinent surgical history.  Family History  Problem Relation Age of Onset   Cancer Mother     Social History:  reports that he has been smoking cigarettes and cigars. He has never used smokeless tobacco. He reports current alcohol use. He reports previous drug use.  Allergies:  Allergies  Allergen Reactions   Niaspan [Niacin]     Other reaction(s): Liver enzymes abnormal   Penicillin G Rash   Zocor [Simvastatin] Rash    Other reaction(s): Liver enzymes abnormal    Medications: Scheduled:  calcium carbonate  1 tablet Oral Q lunch   cholecalciferol  400 Units Oral Daily   darunavir-cobicistat  1 tablet Oral Q lunch   dolutegravir  50 mg Oral Daily   And   lamiVUDine  300 mg Oral Daily   ferrous sulfate  325 mg Oral BID WC   finasteride  5 mg Oral QHS   insulin aspart  0-5 Units Subcutaneous QHS   insulin aspart  0-9 Units Subcutaneous TID WC   mirtazapine  7.5 mg Oral QHS   sodium chloride flush  10 mL Intracatheter Q8H    Results for orders placed or performed during the hospital encounter of 11/25/20 (from the past 48 hour(s))  Basic metabolic panel     Status: Abnormal   Collection Time: 11/25/20  4:24 PM  Result Value Ref Range   Sodium  134 (L) 135 - 145 mmol/L   Potassium 4.2 3.5 - 5.1 mmol/L   Chloride 98 98 - 111 mmol/L   CO2 23 22 - 32 mmol/L   Glucose, Bld 114 (H) 70 - 99 mg/dL    Comment: Glucose reference range applies only to samples taken after fasting for at least 8 hours.   BUN 14 8 - 23 mg/dL   Creatinine, Ser 8.92 (H) 0.61 - 1.24 mg/dL   Calcium 9.4 8.9 - 11.9 mg/dL   GFR, Estimated 53 (L) >60 mL/min    Comment: (NOTE) Calculated using the CKD-EPI Creatinine Equation (2021)    Anion gap 13 5 - 15    Comment: Performed at [facility], [address], [place], [place] [address]  CBC     Status: None   Collection Time: 11/25/20  4:24 PM  Result Value Ref Range   WBC 6.8 4.0 - 10.5 K/uL   RBC 4.90 4.22 - 5.81 MIL/uL   Hemoglobin 14.2 13.0 - 17.0 g/dL   HCT 81.4 48.1 - 85.6 %   MCV 87.1 80.0 - 100.0 fL   MCH 29.0 26.0 - 34.0 pg   MCHC 33.3 30.0 - 36.0 g/dL   RDW 31.4 97.0 - 26.3 %   Platelets 265 150 - 400 K/uL   nRBC 0.0 0.0 - 0.2 %  Comment: Performed at [facility], [address] [place], [place] [address]  Hemoglobin A1c     Status: Abnormal   Collection Time: 11/25/20  4:24 PM  Result Value Ref Range   Hgb A1c MFr Bld 5.7 (H) 4.8 - 5.6 %    Comment: (NOTE)         Prediabetes: 5.7 - 6.4         Diabetes: >6.4         Glycemic control for adults with diabetes: <7.0    Mean Plasma Glucose 117 mg/dL    Comment: (NOTE) Performed At: [facility] [address] [place], [place] [id number] Jolene Schimke MD [id number]   SARS CORONAVIRUS 2 (TAT 6-24 HRS) Nasopharyngeal Nasopharyngeal Swab     Status: None   Collection Time: 11/25/20  7:06 PM   Specimen: Nasopharyngeal Swab  Result Value Ref Range   SARS Coronavirus 2 NEGATIVE NEGATIVE    Comment: (NOTE) SARS-CoV-2 target nucleic acids are NOT DETECTED.  The SARS-CoV-2 RNA is generally detectable in upper and lower respiratory specimens during the acute phase of infection. Negative results do not preclude  SARS-CoV-2 infection, do not rule out co-infections with other pathogens, and should not be used as the sole basis for treatment or other patient management decisions. Negative results must be combined with clinical observations, patient history, and epidemiological information. The expected result is Negative.  Fact Sheet for Patients: HairSlick.no  Fact Sheet for Healthcare Providers: quierodirigir.com  This test is not yet approved or cleared by the [place] FDA and  has been authorized for detection and/or diagnosis of SARS-CoV-2 by FDA under an Emergency Use Authorization (EUA). This EUA will remain  in effect (meaning this test can be used) for the duration of the COVID-19 declaration under Se ction 564(b)(1) of the Act, 21 U.S.C. section 360bbb-3(b)(1), unless the authorization is terminated or revoked sooner.  Performed at [facility], [address] [place], [place] [address]   Glucose, capillary     Status: Abnormal   Collection Time: 11/25/20 11:00 PM  Result Value Ref Range   Glucose-Capillary 119 (H) 70 - 99 mg/dL    Comment: Glucose reference range applies only to samples taken after fasting for at least 8 hours.  Glucose, capillary     Status: Abnormal   Collection Time: 11/26/20  7:26 AM  Result Value Ref Range   Glucose-Capillary 100 (H) 70 - 99 mg/dL    Comment: Glucose reference range applies only to samples taken after fasting for at least 8 hours.  Glucose, capillary     Status: Abnormal   Collection Time: 11/26/20 11:50 AM  Result Value Ref Range   Glucose-Capillary 117 (H) 70 - 99 mg/dL    Comment: Glucose reference range applies only to samples taken after fasting for at least 8 hours.  Glucose, capillary     Status: Abnormal   Collection Time: 11/26/20  4:13 PM  Result Value Ref Range   Glucose-Capillary 171 (H) 70 - 99 mg/dL    Comment: Glucose reference range applies only to samples  taken after fasting for at least 8 hours.  Glucose, capillary     Status: Abnormal   Collection Time: 11/26/20  8:45 PM  Result Value Ref Range   Glucose-Capillary 117 (H) 70 - 99 mg/dL    Comment: Glucose reference range applies only to samples taken after fasting for at least 8 hours.  Glucose, capillary     Status: None  Collection Time: 11/27/20  8:12 AM  Result Value Ref Range   Glucose-Capillary 99 70 - 99 mg/dL    Comment: Glucose reference range applies only to samples taken after fasting for at least 8 hours.  Glucose, capillary     Status: Abnormal   Collection Time: 11/27/20 11:58 AM  Result Value Ref Range   Glucose-Capillary 145 (H) 70 - 99 mg/dL    Comment: Glucose reference range applies only to samples taken after fasting for at least 8 hours.    DG Chest 2 View  Result Date: 11/25/2020 CLINICAL DATA:  Shortness of breath.  Chest pain. EXAM: CHEST - 2 VIEW COMPARISON:  None. FINDINGS: There is a moderate to large right-sided pneumothorax. The pneumothorax measures at least 8.4 cm at the base and 4.8 cm at the apex. There is fluid in the right pleural space as well consistent with a hydropneumothorax. The relatively collapsed right lung is not otherwise well assessed. There is some shifting of the mediastinum to the left consistent with a tension pneumothora on the right. No pneumothorax on the left. No infiltrate on the left. No other acute abnormalities. Anterior wedging of a 2 lower thoracic vertebral bodies is age indeterminate. IMPRESSION: 1. Right-sided tension pneumothorax. Fluid in the right pleural space. 2. Age-indeterminate anterior wedging of 2 lower thoracic vertebral bodies. Findings called to Dr. Donnald GarrePfeiffer. Electronically Signed   By: Gerome Samavid  Williams III M.D   On: 11/25/2020 17:14   DG Chest Port 1 View  Result Date: 11/27/2020 CLINICAL DATA:  Shortness of breath. EXAM: PORTABLE CHEST 1 VIEW COMPARISON:  November 26, 2020. FINDINGS: The heart size and mediastinal  contours are within normal limits. Stable position of right-sided chest tube with stable small right apical pneumothorax. Right basilar atelectasis is noted. Left lung is clear. The visualized skeletal structures are unremarkable. IMPRESSION: Stable right-sided chest tube with stable right apical pneumothorax. Electronically Signed   By: Lupita RaiderJames  Green Jr M.D.   On: 11/27/2020 12:16   DG CHEST PORT 1 VIEW  Result Date: 11/26/2020 CLINICAL DATA:  Follow-up right pneumothorax EXAM: PORTABLE CHEST 1 VIEW COMPARISON:  11/25/2020 FINDINGS: Unchanged small, approximately 15% right apical pneumothorax with right-sided pigtail chest tube in position. The left lung is normally aerated. Heart and mediastinum are unremarkable. IMPRESSION: Unchanged small, approximately 15% right apical pneumothorax with right-sided pigtail chest tube in position. Electronically Signed   By: Lauralyn PrimesAlex  Bibbey M.D.   On: 11/26/2020 18:21   DG Chest Portable 1 View  Result Date: 11/25/2020 CLINICAL DATA:  Status post chest tube placement. EXAM: PORTABLE CHEST 1 VIEW COMPARISON:  November 25, 2020 FINDINGS: A right-sided chest tube is seen with its distal end overlying the lateral aspect of the mid right lung. A small residual right apical pneumothorax is seen. A small right pleural effusion is also noted. Mild right infrahilar atelectasis and/or infiltrate is present. The heart size and mediastinal contours are within normal limits. Multiple chronic left-sided rib fractures are seen. IMPRESSION: Interval right-sided chest tube placement and positioning, as described above, with a small residual right apical pneumothorax. Electronically Signed   By: Aram Candelahaddeus  Houston M.D.   On: 11/25/2020 22:29    Review of Systems  Constitutional:  Negative for unexpected weight change.  Respiratory:  Positive for cough and shortness of breath.   Cardiovascular:  Negative for leg swelling.  All other systems reviewed and are negative. Blood pressure 120/87,  pulse 90, temperature 98.5 F (36.9 C), temperature source Oral, resp. rate 18, height  5\' 10"  (1.778 m), weight 58.1 kg, SpO2 95 %. Physical Exam Vitals reviewed.  Constitutional:      General: He is not in acute distress. HENT:     Head: Normocephalic and atraumatic.  Eyes:     General: No scleral icterus.    Extraocular Movements: Extraocular movements intact.  Cardiovascular:     Rate and Rhythm: Normal rate and regular rhythm.     Heart sounds: Normal heart sounds. No murmur heard. Pulmonary:     Effort: No respiratory distress.     Breath sounds: Normal breath sounds. No wheezing or rales.     Comments: Chest tube in place, + air leak with cough Abdominal:     General: There is no distension.     Palpations: Abdomen is soft.  Musculoskeletal:     Cervical back: Neck supple.  Skin:    General: Skin is warm and dry.  Neurological:     General: No focal deficit present.     Mental Status: He is alert and oriented to person, place, and time.     Cranial Nerves: No cranial nerve deficit.    Assessment/Plan: 75 yo man with a history of tobacco abuse, HIV and type II diabetes presented with new onset shortness of breath. CXr showed a large right pneumothorax. A pigtail catheter was placed with good reexpansion of lung.  He currently is on suction. There is a small residual pneumothorax. He has an air leak with cough.  Would leave on suction today.   Will follow  66 11/27/2020, 3:51 PM

## 2020-11-28 ENCOUNTER — Inpatient Hospital Stay (HOSPITAL_COMMUNITY): Payer: Medicare PPO

## 2020-11-28 DIAGNOSIS — J9311 Primary spontaneous pneumothorax: Secondary | ICD-10-CM

## 2020-11-28 LAB — GLUCOSE, CAPILLARY
Glucose-Capillary: 102 mg/dL — ABNORMAL HIGH (ref 70–99)
Glucose-Capillary: 137 mg/dL — ABNORMAL HIGH (ref 70–99)
Glucose-Capillary: 96 mg/dL (ref 70–99)
Glucose-Capillary: 107 mg/dL — ABNORMAL HIGH (ref 70–99)

## 2020-11-28 LAB — BASIC METABOLIC PANEL
Anion gap: 8 (ref 5–15)
BUN: 12 mg/dL (ref 8–23)
CO2: 25 mmol/L (ref 22–32)
Calcium: 9.2 mg/dL (ref 8.9–10.3)
Chloride: 100 mmol/L (ref 98–111)
Creatinine, Ser: 1.23 mg/dL (ref 0.61–1.24)
GFR, Estimated: >60 mL/min (ref >60)
Glucose, Bld: 100 mg/dL — ABNORMAL HIGH (ref 70–99)
Potassium: 4.3 mmol/L (ref 3.5–5.1)
Sodium: 133 mmol/L — ABNORMAL LOW (ref 135–145)

## 2020-11-28 NOTE — Progress Notes (Signed)
PROGRESS NOTE    Chris Jennings  TMH:[id number] DOB: 04-15-1946 DOA: 11/25/2020 PCP: [facility]   Brief Narrative:  75 year old with a history of DM, HIV, and PTSD who presented to the ED with the sudden onset of severe shortness of breath.  Evaluation revealed a right-sided tension pneumothorax.  A chest tube was placed in the ED.  Assessment & Plan:   Principal Problem:   Tension pneumothorax, spontaneous Active Problems:   HIV (human immunodeficiency virus infection) (HCC)   Diabetes (HCC)   Right spontaneous tension pneumothorax Chest tube placed in ED at intake TCTS to consult for tube management - small air leak today - continues on suction   HIV Continue usual home medications   DM2 CBG well controlled at this time   BPH Continue usual Proscar -asymptomatic presently   DVT prophylaxis: Lovenox Code Status: Full Family Communication: None present  Status is: Inpt  Dispo: The patient is from: Home              Anticipated d/c is to: Home              Anticipated d/c date is: 48 to 72 hours              Patient currently not medically stable for discharge  Consultants:  Cardiothoracic Sx  Procedures:  Chest tube placement  Antimicrobials:  None indicated  Subjective: No acute issues or events overnight, chest tube remains in place with airleak, notes ongoing discomfort with chest tube placement but otherwise denies nausea vomiting diarrhea constipation headache fevers chills.  Objective: Vitals:   11/27/20 1632 11/27/20 1859 11/27/20 2112 11/28/20 0500  BP: 121/77  (!) 147/81 125/63  Pulse: 83 87 89 81  Resp: 18   18  Temp: 98.3 F (36.8 C)   98.4 F (36.9 C)  TempSrc: Oral   Oral  SpO2: 99% 99% 100% 98%  Weight:      Height:        Intake/Output Summary (Last 24 hours) at 11/28/2020 0803 Last data filed at 11/28/2020 0500 Gross per 24 hour  Intake 420 ml  Output 230 ml  Net 190 ml   Filed Weights   11/25/20 2154  Weight:  58.1 kg    Examination:  General exam: Appears calm and comfortable  Respiratory system: Clear to auscultation. Respiratory effort normal.  Right-sided chest tube airleak ongoing, to suction Cardiovascular system: S1 & S2 heard, RRR. No JVD, murmurs, rubs, gallops or clicks. No pedal edema. Gastrointestinal system: Abdomen is nondistended, soft and nontender. No organomegaly or masses felt. Normal bowel sounds heard. Central nervous system: Alert and oriented. No focal neurological deficits. Extremities: Symmetric 5 x 5 power. Skin: No rashes, lesions or ulcers Psychiatry: Judgement and insight appear normal. Mood & affect appropriate.     Data Reviewed: I have personally reviewed following labs and imaging studies  CBC: Recent Labs  Lab 11/25/20 1624  WBC 6.8  HGB 14.2  HCT 42.7  MCV 87.1  PLT 265   Basic Metabolic Panel: Recent Labs  Lab 11/25/20 1624 11/28/20 0313  NA 134* 133*  K 4.2 4.3  CL 98 100  CO2 23 25  GLUCOSE 114* 100*  BUN 14 12  CREATININE 1.39* 1.23  CALCIUM 9.4 9.2   GFR: Estimated Creatinine Clearance: 43.3 mL/min (by C-G formula based on SCr of 1.23 mg/dL). Liver Function Tests: No results for input(s): AST, ALT, ALKPHOS, BILITOT, PROT, ALBUMIN in the last 168 hours. No results for input(s):  LIPASE, AMYLASE in the last 168 hours. No results for input(s): AMMONIA in the last 168 hours. Coagulation Profile: No results for input(s): INR, PROTIME in the last 168 hours. Cardiac Enzymes: No results for input(s): CKTOTAL, CKMB, CKMBINDEX, TROPONINI in the last 168 hours. BNP (last 3 results) No results for input(s): PROBNP in the last 8760 hours. HbA1C: Recent Labs    11/25/20 1624  HGBA1C 5.7*   CBG: Recent Labs  Lab 11/26/20 2045 11/27/20 0812 11/27/20 1158 11/27/20 1634 11/27/20 2155  GLUCAP 117* 99 145* 99 132*   Lipid Profile: No results for input(s): CHOL, HDL, LDLCALC, TRIG, CHOLHDL, LDLDIRECT in the last 72 hours. Thyroid  Function Tests: No results for input(s): TSH, T4TOTAL, FREET4, T3FREE, THYROIDAB in the last 72 hours. Anemia Panel: No results for input(s): VITAMINB12, FOLATE, FERRITIN, TIBC, IRON, RETICCTPCT in the last 72 hours. Sepsis Labs: No results for input(s): PROCALCITON, LATICACIDVEN in the last 168 hours.  Recent Results (from the past 240 hour(s))  SARS CORONAVIRUS 2 (TAT 6-24 HRS) Nasopharyngeal Nasopharyngeal Swab     Status: None   Collection Time: 11/25/20  7:06 PM   Specimen: Nasopharyngeal Swab  Result Value Ref Range Status   SARS Coronavirus 2 NEGATIVE NEGATIVE Final    Comment: (NOTE) SARS-CoV-2 target nucleic acids are NOT DETECTED.  The SARS-CoV-2 RNA is generally detectable in upper and lower respiratory specimens during the acute phase of infection. Negative results do not preclude SARS-CoV-2 infection, do not rule out co-infections with other pathogens, and should not be used as the sole basis for treatment or other patient management decisions. Negative results must be combined with clinical observations, patient history, and epidemiological information. The expected result is Negative.  Fact Sheet for Patients: HairSlick.no  Fact Sheet for Healthcare Providers: quierodirigir.com  This test is not yet approved or cleared by the [place] FDA and  has been authorized for detection and/or diagnosis of SARS-CoV-2 by FDA under an Emergency Use Authorization (EUA). This EUA will remain  in effect (meaning this test can be used) for the duration of the COVID-19 declaration under Se ction 564(b)(1) of the Act, 21 U.S.C. section 360bbb-3(b)(1), unless the authorization is terminated or revoked sooner.  Performed at [facility], [address] [place], [place] [address]          Radiology Studies: DG Chest Port 1 View  Result Date: 11/28/2020 CLINICAL DATA:  Pneumothorax.  Chest tube. EXAM:  PORTABLE CHEST 1 VIEW COMPARISON:  11/27/2020. FINDINGS: Right chest tube in stable position. Stable right apical pneumothorax. Heart size stable. Mild right base atelectasis again noted. Old left rib fractures again noted. No acute bony abnormality identified. IMPRESSION: Right chest tube in stable position. Stable right apical pneumothorax. 2.  Mild right base atelectasis again noted. Electronically Signed   By: Maisie Fus  Register   On: 11/28/2020 06:33   DG Chest Port 1 View  Result Date: 11/27/2020 CLINICAL DATA:  Shortness of breath. EXAM: PORTABLE CHEST 1 VIEW COMPARISON:  November 26, 2020. FINDINGS: The heart size and mediastinal contours are within normal limits. Stable position of right-sided chest tube with stable small right apical pneumothorax. Right basilar atelectasis is noted. Left lung is clear. The visualized skeletal structures are unremarkable. IMPRESSION: Stable right-sided chest tube with stable right apical pneumothorax. Electronically Signed   By: Lupita Raider M.D.   On: 11/27/2020 12:16   DG CHEST PORT 1 VIEW  Result Date: 11/26/2020 CLINICAL DATA:  Follow-up right pneumothorax EXAM: PORTABLE CHEST  1 VIEW COMPARISON:  11/25/2020 FINDINGS: Unchanged small, approximately 15% right apical pneumothorax with right-sided pigtail chest tube in position. The left lung is normally aerated. Heart and mediastinum are unremarkable. IMPRESSION: Unchanged small, approximately 15% right apical pneumothorax with right-sided pigtail chest tube in position. Electronically Signed   By: Lauralyn Primes M.D.   On: 11/26/2020 18:21    Scheduled Meds:  calcium carbonate  1 tablet Oral Q lunch   cholecalciferol  400 Units Oral Daily   darunavir-cobicistat  1 tablet Oral Q lunch   dolutegravir  50 mg Oral Daily   And   lamiVUDine  300 mg Oral Daily   ferrous sulfate  325 mg Oral BID WC   finasteride  5 mg Oral QHS   insulin aspart  0-5 Units Subcutaneous QHS   insulin aspart  0-9 Units Subcutaneous TID  WC   mirtazapine  7.5 mg Oral QHS   sodium chloride flush  10 mL Intracatheter Q8H    LOS: 3 days   Time spent:  Azucena Fallen, DO [facility]  If 7PM-7AM, please contact night-coverage [email or web address]  11/28/2020, 8:03 AM

## 2020-11-28 NOTE — Progress Notes (Signed)
  Subjective: No complaints this AM, denies pain  Objective: Vital signs in last 24 hours: Temp:  [98.3 F (36.8 C)-98.5 F (36.9 C)] 98.4 F (36.9 C) (06/28 0500) Pulse Rate:  [81-90] 81 (06/28 0500) Cardiac Rhythm: Normal sinus rhythm (06/27 1908) Resp:  [18] 18 (06/28 0500) BP: (120-147)/(63-87) 125/63 (06/28 0500) SpO2:  [95 %-100 %] 98 % (06/28 0500)  Hemodynamic parameters for last 24 hours:    Intake/Output from previous day: 06/27 0701 - 06/28 0700 In: 420 [P.O.:400] Out: 290 [Urine:200; Chest Tube:90] Intake/Output this shift: No intake/output data recorded.  General appearance: alert, cooperative, and no distress Lungs: clear to auscultation bilaterally + air leak with cough  Lab Results: Recent Labs    11/25/20 1624  WBC 6.8  HGB 14.2  HCT 42.7  PLT 265   BMET:  Recent Labs    11/25/20 1624 11/28/20 0313  NA 134* 133*  K 4.2 4.3  CL 98 100  CO2 23 25  GLUCOSE 114* 100*  BUN 14 12  CREATININE 1.39* 1.23  CALCIUM 9.4 9.2    PT/INR: No results for input(s): LABPROT, INR in the last 72 hours. ABG No results found for: PHART, HCO3, TCO2, ACIDBASEDEF, O2SAT CBG (last 3)  Recent Labs    11/27/20 1158 11/27/20 1634 11/27/20 2155  GLUCAP 145* 99 132*    Assessment/Plan:  Spontaneous pneumothorax CXR relatively stable + air leak with cough, but decreased from yesterday- will keep on suction today Ambulate    LOS: 3 days    Loreli Slot 11/28/2020

## 2020-11-28 NOTE — Care Management Important Message (Signed)
Important Message  Patient Details  Name: Chris Jennings MRN: [id number] Date of Birth: 23-Aug-1945   Medicare Important Message Given:  Yes     Dorena Bodo 11/28/2020, 2:58 PM

## 2020-11-29 ENCOUNTER — Inpatient Hospital Stay (HOSPITAL_COMMUNITY): Payer: Medicare PPO

## 2020-11-29 DIAGNOSIS — E0801 Diabetes mellitus due to underlying condition with hyperosmolarity with coma: Secondary | ICD-10-CM

## 2020-11-29 DIAGNOSIS — J9311 Primary spontaneous pneumothorax: Secondary | ICD-10-CM

## 2020-11-29 DIAGNOSIS — J9383 Other pneumothorax: Secondary | ICD-10-CM

## 2020-11-29 LAB — BASIC METABOLIC PANEL
Anion gap: 6 (ref 5–15)
BUN: 14 mg/dL (ref 8–23)
CO2: 26 mmol/L (ref 22–32)
Calcium: 9.3 mg/dL (ref 8.9–10.3)
Chloride: 100 mmol/L (ref 98–111)
Creatinine, Ser: 1.08 mg/dL (ref 0.61–1.24)
GFR, Estimated: >60 mL/min (ref >60)
Glucose, Bld: 151 mg/dL — ABNORMAL HIGH (ref 70–99)
Potassium: 3.8 mmol/L (ref 3.5–5.1)
Sodium: 132 mmol/L — ABNORMAL LOW (ref 135–145)

## 2020-11-29 LAB — CBC
HCT: 35.8 % — ABNORMAL LOW (ref 39.0–52.0)
Hemoglobin: 12.4 g/dL — ABNORMAL LOW (ref 13.0–17.0)
MCH: 29.8 pg (ref 26.0–34.0)
MCHC: 34.6 g/dL (ref 30.0–36.0)
MCV: 86.1 fL (ref 80.0–100.0)
Platelets: 155 10*3/uL (ref 150–400)
RBC: 4.16 MIL/uL — ABNORMAL LOW (ref 4.22–5.81)
RDW: 14.6 % (ref 11.5–15.5)
WBC: 4.6 10*3/uL (ref 4.0–10.5)
nRBC: 0 % (ref 0.0–0.2)

## 2020-11-29 LAB — GLUCOSE, CAPILLARY
Glucose-Capillary: 137 mg/dL — ABNORMAL HIGH (ref 70–99)
Glucose-Capillary: 178 mg/dL — ABNORMAL HIGH (ref 70–99)
Glucose-Capillary: 106 mg/dL — ABNORMAL HIGH (ref 70–99)
Glucose-Capillary: 191 mg/dL — ABNORMAL HIGH (ref 70–99)

## 2020-11-29 NOTE — Progress Notes (Signed)
Spoke with Dr Daleen Squibb, Patient noted sleeping at this time, O2 sat 91-93% ,may titrate O2  if needed to keep sat>92%  per above MD, For repeat CXR in AM ( already on order). Continue to monitor patient.

## 2020-11-29 NOTE — Progress Notes (Signed)
Patient c/o SOB "feeling tight " when trying to take deep breath O2 sat 94% on O2 2l/m [place], R Chest tube intact to water seal. Ocassional non productive cough noted. Vital signs  stable (see flow sheet)Dr Wall notified.

## 2020-11-29 NOTE — Progress Notes (Signed)
PROGRESS NOTE    Chris Jennings  GYI:[id number] DOB: 1945-11-07 DOA: 11/25/2020 PCP: [facility]    Brief Narrative:  75 year old with a history of DM, HIV, and PTSD who presented to the ED with the sudden onset of severe shortness of breath.  Evaluation revealed a right-sided tension pneumothorax.  A chest tube was placed in the ED.  Assessment & Plan:   Principal Problem:   Tension pneumothorax, spontaneous Active Problems:   HIV (human immunodeficiency virus infection) (HCC)   Diabetes (HCC)  Right spontaneous tension pneumothorax Chest tube placed in ED at time of presentation CT surgery following. Pt continues on suction   HIV Continue usual home medications   DM2 CBG trends reviewed Stable   BPH Continue usual Proscar  Stable at this time    DVT prophylaxis: SCD's Code Status: Full Family Communication: Pt in room, family not at bedside  Status is: Inpatient  Remains inpatient appropriate because:Inpatient level of care appropriate due to severity of illness  Dispo: The patient is from: Home              Anticipated d/c is to: Home              Patient currently is not medically stable to d/c.   Difficult to place patient No       Consultants:  CT Surgery  Procedures:    Antimicrobials: Anti-infectives (From admission, onward)    Start     Dose/Rate Route Frequency Ordered Stop   11/26/20 1200  darunavir-cobicistat (PREZCOBIX) 800-150 MG per tablet 1 tablet        1 tablet Oral Daily with lunch 11/26/20 0856     11/26/20 1000  Dolutegravir-lamiVUDine 50-300 MG TABS 1 tablet  Status:  Discontinued        1 tablet Oral Daily 11/26/20 0856 11/26/20 0907   11/26/20 1000  dolutegravir (TIVICAY) tablet 50 mg       See Hyperspace for full Linked Orders Report.   50 mg Oral Daily 11/26/20 0910     11/26/20 1000  lamiVUDine (EPIVIR) tablet 300 mg       See Hyperspace for full Linked Orders Report.   300 mg Oral Daily 11/26/20 0910          Subjective: Without complaints. Reports ambulating in hallway overnight  Objective: Vitals:   11/28/20 1439 11/28/20 2026 11/29/20 0524 11/29/20 0905  BP: 135/84 113/79 119/77 110/68  Pulse: 78 71 70 91  Resp: 18 18 18 18   Temp: 98.1 F (36.7 C) 97.9 F (36.6 C) 98.6 F (37 C) 98.5 F (36.9 C)  TempSrc: Oral Oral Oral Oral  SpO2: 97% 98% 96% 94%  Weight:      Height:        Intake/Output Summary (Last 24 hours) at 11/29/2020 1637 Last data filed at 11/29/2020 12/01/2020 Gross per 24 hour  Intake 260 ml  Output 510 ml  Net -250 ml   Filed Weights   11/25/20 2154  Weight: 58.1 kg    Examination: General exam: Awake, laying in bed, in nad Respiratory system: Normal respiratory effort, no wheezing, chest pain in place Cardiovascular system: regular rate, s1, s2 Gastrointestinal system: Soft, nondistended, positive BS Central nervous system: CN2-12 grossly intact, strength intact Extremities: Perfused, no clubbing Skin: Normal skin turgor, no notable skin lesions seen Psychiatry: Mood normal // no visual hallucinations   Data Reviewed: I have personally reviewed following labs and imaging studies  CBC: Recent Labs  Lab 11/25/20 1624  11/29/20 0302  WBC 6.8 4.6  HGB 14.2 12.4*  HCT 42.7 35.8*  MCV 87.1 86.1  PLT 265 155   Basic Metabolic Panel: Recent Labs  Lab 11/25/20 1624 11/28/20 0313 11/29/20 0302  NA 134* 133* 132*  K 4.2 4.3 3.8  CL 98 100 100  CO2 23 25 26   GLUCOSE 114* 100* 151*  BUN 14 12 14   CREATININE 1.39* 1.23 1.08  CALCIUM 9.4 9.2 9.3   GFR: Estimated Creatinine Clearance: 49.3 mL/min (by C-G formula based on SCr of 1.08 mg/dL). Liver Function Tests: No results for input(s): AST, ALT, ALKPHOS, BILITOT, PROT, ALBUMIN in the last 168 hours. No results for input(s): LIPASE, AMYLASE in the last 168 hours. No results for input(s): AMMONIA in the last 168 hours. Coagulation Profile: No results for input(s): INR, PROTIME in the last 168  hours. Cardiac Enzymes: No results for input(s): CKTOTAL, CKMB, CKMBINDEX, TROPONINI in the last 168 hours. BNP (last 3 results) No results for input(s): PROBNP in the last 8760 hours. HbA1C: No results for input(s): HGBA1C in the last 72 hours. CBG: Recent Labs  Lab 11/28/20 1123 11/28/20 1611 11/28/20 2239 11/29/20 0746 11/29/20 1201  GLUCAP 137* 96 107* 137* 178*   Lipid Profile: No results for input(s): CHOL, HDL, LDLCALC, TRIG, CHOLHDL, LDLDIRECT in the last 72 hours. Thyroid Function Tests: No results for input(s): TSH, T4TOTAL, FREET4, T3FREE, THYROIDAB in the last 72 hours. Anemia Panel: No results for input(s): VITAMINB12, FOLATE, FERRITIN, TIBC, IRON, RETICCTPCT in the last 72 hours. Sepsis Labs: No results for input(s): PROCALCITON, LATICACIDVEN in the last 168 hours.  Recent Results (from the past 240 hour(s))  SARS CORONAVIRUS 2 (TAT 6-24 HRS) Nasopharyngeal Nasopharyngeal Swab     Status: None   Collection Time: 11/25/20  7:06 PM   Specimen: Nasopharyngeal Swab  Result Value Ref Range Status   SARS Coronavirus 2 NEGATIVE NEGATIVE Final    Comment: (NOTE) SARS-CoV-2 target nucleic acids are NOT DETECTED.  The SARS-CoV-2 RNA is generally detectable in upper and lower respiratory specimens during the acute phase of infection. Negative results do not preclude SARS-CoV-2 infection, do not rule out co-infections with other pathogens, and should not be used as the sole basis for treatment or other patient management decisions. Negative results must be combined with clinical observations, patient history, and epidemiological information. The expected result is Negative.  Fact Sheet for Patients: 12/01/20  Fact Sheet for Healthcare Providers: 11/27/20  This test is not yet approved or cleared by the [employer] FDA and  has been authorized for detection and/or diagnosis of SARS-CoV-2 by FDA  under an Emergency Use Authorization (EUA). This EUA will remain  in effect (meaning this test can be used) for the duration of the COVID-19 declaration under Se ction 564(b)(1) of the Act, 21 U.S.C. section 360bbb-3(b)(1), unless the authorization is terminated or revoked sooner.  Performed at [facility], [address], [place], [address] [place]      Radiology Studies: DG Chest Port 1 View  Result Date: 11/29/2020 CLINICAL DATA:  Pneumothorax.  Chest tube. EXAM: PORTABLE CHEST 1 VIEW COMPARISON:  11/28/2020. FINDINGS: Right chest tube in stable position. Stable right apical pneumothorax. A small right basilar component also noted. No focal infiltrate. Heart size stable. Old left rib fractures again noted. IMPRESSION: Right chest tube in stable position. Stable small right apical pneumothorax. Small right basilar pneumothorax component also noted. Electronically Signed   By: 12/01/2020  Register   On: 11/29/2020 05:36  DG Chest Port 1 View  Result Date: 11/28/2020 CLINICAL DATA:  Pneumothorax.  Chest tube. EXAM: PORTABLE CHEST 1 VIEW COMPARISON:  11/27/2020. FINDINGS: Right chest tube in stable position. Stable right apical pneumothorax. Heart size stable. Mild right base atelectasis again noted. Old left rib fractures again noted. No acute bony abnormality identified. IMPRESSION: Right chest tube in stable position. Stable right apical pneumothorax. 2.  Mild right base atelectasis again noted. Electronically Signed   By: Maisie Fus  Register   On: 11/28/2020 06:33    Scheduled Meds:  calcium carbonate  1 tablet Oral Q lunch   cholecalciferol  400 Units Oral Daily   darunavir-cobicistat  1 tablet Oral Q lunch   dolutegravir  50 mg Oral Daily   And   lamiVUDine  300 mg Oral Daily   ferrous sulfate  325 mg Oral BID WC   finasteride  5 mg Oral QHS   insulin aspart  0-5 Units Subcutaneous QHS   insulin aspart  0-9 Units Subcutaneous TID WC   mirtazapine  7.5 mg Oral QHS   sodium  chloride flush  10 mL Intracatheter Q8H   Continuous Infusions:   LOS: 4 days   Rickey Barbara, MD [facility] Pager On Amion  If 7PM-7AM, please contact night-coverage 11/29/2020, 4:37 PM

## 2020-11-29 NOTE — Progress Notes (Addendum)
      [address]       [name] [address]             [phone]    Subjective:  Patient has no complaints.  Overall hanging in there  Objective: Vital signs in last 24 hours: Temp:  [97.9 F (36.6 C)-98.6 F (37 C)] 98.6 F (37 C) (06/29 0524) Pulse Rate:  [70-79] 70 (06/29 0524) Cardiac Rhythm: Normal sinus rhythm (06/28 1907) Resp:  [16-18] 18 (06/29 0524) BP: (113-135)/(77-84) 119/77 (06/29 0524) SpO2:  [96 %-98 %] 96 % (06/29 0524)  Intake/Output from previous day: 06/28 0701 - 06/29 0700 In: 20  Out: 510 [Urine:480; Chest Tube:30]  General appearance: alert, cooperative, and no distress Heart: regular rate and rhythm Lungs: clear to auscultation bilaterally Wound: clean and dry  Lab Results: Recent Labs    11/29/20 0302  WBC 4.6  HGB 12.4*  HCT 35.8*  PLT 155   BMET:  Recent Labs    11/28/20 0313 11/29/20 0302  NA 133* 132*  K 4.3 3.8  CL 100 100  CO2 25 26  GLUCOSE 100* 151*  BUN 12 14  CREATININE 1.23 1.08  CALCIUM 9.2 9.3    PT/INR: No results for input(s): LABPROT, INR in the last 72 hours. ABG No results found for: PHART, HCO3, TCO2, ACIDBASEDEF, O2SAT CBG (last 3)  Recent Labs    11/28/20 1611 11/28/20 2239 11/29/20 0746  GLUCAP 96 107* 137*    Assessment/Plan:  Spontaneous Pneumothorax, right-CT in place, + air leak with cough on suction.. CXR with stable apical and basilar component... ? Continue suction today vs trial on water seal, repeat CXR in AM   LOS: 4 days    Lowella Dandy, PA-C 11/29/2020  Patient seen and examined, agree with above CXR about the same to minimally improved, still has air leak with cough Will try on water seal. May end up needing surgery  Viviann Spare C. Dorris Fetch, MD [facility] [phone]

## 2020-11-30 ENCOUNTER — Inpatient Hospital Stay (HOSPITAL_COMMUNITY): Payer: Medicare PPO | Admitting: Certified Registered"

## 2020-11-30 ENCOUNTER — Inpatient Hospital Stay (HOSPITAL_COMMUNITY): Payer: Medicare PPO

## 2020-11-30 ENCOUNTER — Encounter (HOSPITAL_COMMUNITY): Payer: Self-pay | Admitting: Internal Medicine

## 2020-11-30 ENCOUNTER — Encounter (HOSPITAL_COMMUNITY)
Admission: EM | Disposition: A | Discharge status: Home / Self Care | Payer: Self-pay | Source: Ambulatory Visit | Attending: Internal Medicine

## 2020-11-30 DIAGNOSIS — J9311 Primary spontaneous pneumothorax: Secondary | ICD-10-CM

## 2020-11-30 HISTORY — PX: INTERCOSTAL NERVE BLOCK: SHX5021

## 2020-11-30 HISTORY — PX: XI ROBOTIC ASSISTED THORACOSCOPY PLEURECTOMY: SHX6888

## 2020-11-30 LAB — BASIC METABOLIC PANEL
Anion gap: 9 (ref 5–15)
BUN: 12 mg/dL (ref 8–23)
CO2: 23 mmol/L (ref 22–32)
Calcium: 9.3 mg/dL (ref 8.9–10.3)
Chloride: 99 mmol/L (ref 98–111)
Creatinine, Ser: 1.02 mg/dL (ref 0.61–1.24)
GFR, Estimated: >60 mL/min (ref >60)
Glucose, Bld: 108 mg/dL — ABNORMAL HIGH (ref 70–99)
Potassium: 3.8 mmol/L (ref 3.5–5.1)
Sodium: 131 mmol/L — ABNORMAL LOW (ref 135–145)

## 2020-11-30 LAB — TYPE AND SCREEN
ABO/RH(D): O POS
Antibody Screen: NEGATIVE

## 2020-11-30 LAB — POCT I-STAT 7, (LYTES, BLD GAS, ICA,H+H)
Acid-Base Excess: 0 mmol/L (ref 0.0–2.0)
Acid-base deficit: 2 mmol/L (ref 0.0–2.0)
Bicarbonate: 24.5 mmol/L (ref 20.0–28.0)
Bicarbonate: 24.7 mmol/L (ref 20.0–28.0)
Calcium, Ion: 1.19 mmol/L (ref 1.15–1.40)
Calcium, Ion: 1.2 mmol/L (ref 1.15–1.40)
HCT: 34 % — ABNORMAL LOW (ref 39.0–52.0)
HCT: 34 % — ABNORMAL LOW (ref 39.0–52.0)
Hemoglobin: 11.6 g/dL — ABNORMAL LOW (ref 13.0–17.0)
Hemoglobin: 11.6 g/dL — ABNORMAL LOW (ref 13.0–17.0)
O2 Saturation: 98 %
O2 Saturation: 100 %
Patient temperature: 35.8
Patient temperature: 35.3
Potassium: 3.7 mmol/L (ref 3.5–5.1)
Potassium: 4.1 mmol/L (ref 3.5–5.1)
Sodium: 135 mmol/L (ref 135–145)
Sodium: 135 mmol/L (ref 135–145)
TCO2: 26 mmol/L (ref 22–32)
TCO2: 26 mmol/L (ref 22–32)
pCO2 arterial: 46.7 mmHg (ref 32.0–48.0)
pCO2 arterial: 38.4 mmHg (ref 32.0–48.0)
pH, Arterial: 7.322 — ABNORMAL LOW (ref 7.350–7.450)
pH, Arterial: 7.408 (ref 7.350–7.450)
pO2, Arterial: 107 mmHg (ref 83.0–108.0)
pO2, Arterial: 162 mmHg — ABNORMAL HIGH (ref 83.0–108.0)

## 2020-11-30 LAB — BLOOD GAS, ARTERIAL
Acid-Base Excess: 0.5 mmol/L (ref 0.0–2.0)
Bicarbonate: 24.4 mmol/L (ref 20.0–28.0)
Drawn by: 51185
FIO2: 21
O2 Saturation: 93 %
Patient temperature: 37
pCO2 arterial: 38.5 mmHg (ref 32.0–48.0)
pH, Arterial: 7.419 (ref 7.350–7.450)
pO2, Arterial: 70.5 mmHg — ABNORMAL LOW (ref 83.0–108.0)

## 2020-11-30 LAB — COMPREHENSIVE METABOLIC PANEL
ALT: 16 U/L (ref 0–44)
AST: 24 U/L (ref 15–41)
Albumin: 3 g/dL — ABNORMAL LOW (ref 3.5–5.0)
Alkaline Phosphatase: 51 U/L (ref 38–126)
Anion gap: 10 (ref 5–15)
BUN: 11 mg/dL (ref 8–23)
CO2: 20 mmol/L — ABNORMAL LOW (ref 22–32)
Calcium: 9.1 mg/dL (ref 8.9–10.3)
Chloride: 103 mmol/L (ref 98–111)
Creatinine, Ser: 0.97 mg/dL (ref 0.61–1.24)
GFR, Estimated: >60 mL/min (ref >60)
Glucose, Bld: 126 mg/dL — ABNORMAL HIGH (ref 70–99)
Potassium: 4.2 mmol/L (ref 3.5–5.1)
Sodium: 133 mmol/L — ABNORMAL LOW (ref 135–145)
Total Bilirubin: 0.9 mg/dL (ref 0.3–1.2)
Total Protein: 6.9 g/dL (ref 6.5–8.1)

## 2020-11-30 LAB — CBC
HCT: 35.8 % — ABNORMAL LOW (ref 39.0–52.0)
Hemoglobin: 12.6 g/dL — ABNORMAL LOW (ref 13.0–17.0)
MCH: 29.8 pg (ref 26.0–34.0)
MCHC: 35.2 g/dL (ref 30.0–36.0)
MCV: 84.6 fL (ref 80.0–100.0)
Platelets: 189 10*3/uL (ref 150–400)
RBC: 4.23 MIL/uL (ref 4.22–5.81)
RDW: 14.3 % (ref 11.5–15.5)
WBC: 4.9 10*3/uL (ref 4.0–10.5)
nRBC: 0 % (ref 0.0–0.2)

## 2020-11-30 LAB — GLUCOSE, CAPILLARY
Glucose-Capillary: 132 mg/dL — ABNORMAL HIGH (ref 70–99)
Glucose-Capillary: 142 mg/dL — ABNORMAL HIGH (ref 70–99)
Glucose-Capillary: 138 mg/dL — ABNORMAL HIGH (ref 70–99)
Glucose-Capillary: 148 mg/dL — ABNORMAL HIGH (ref 70–99)

## 2020-11-30 LAB — SURGICAL PCR SCREEN
MRSA, PCR: NEGATIVE
Staphylococcus aureus: NEGATIVE

## 2020-11-30 LAB — ABO/RH: ABO/RH(D): O POS

## 2020-11-30 LAB — PROTIME-INR
INR: 1 (ref 0.8–1.2)
Prothrombin Time: 13.2 seconds (ref 11.4–15.2)

## 2020-11-30 LAB — APTT: aPTT: 31 seconds (ref 24–36)

## 2020-11-30 SURGERY — DECORTICATION, LUNG, ROBOT-ASSISTED
Anesthesia: General | Site: Chest | Laterality: Right

## 2020-11-30 MED ORDER — ONDANSETRON HCL 4 MG/2ML IJ SOLN
INTRAMUSCULAR | Status: DC | PRN
  Administered 2020-11-30: 4 mg via INTRAVENOUS

## 2020-11-30 MED ORDER — ROCURONIUM BROMIDE 10 MG/ML (PF) SYRINGE
PREFILLED_SYRINGE | INTRAVENOUS | Status: DC | PRN
  Administered 2020-11-30: 60 mg via INTRAVENOUS
  Administered 2020-11-30: 30 mg via INTRAVENOUS

## 2020-11-30 MED ORDER — CHLORHEXIDINE GLUCONATE 0.12 % MT SOLN: 15.0000 mL | Freq: Once | OROMUCOSAL | Status: AC

## 2020-11-30 MED ORDER — BISACODYL 5 MG PO TBEC
10.0000 mg | DELAYED_RELEASE_TABLET | Freq: Every day | ORAL | Status: DC
  Administered 2020-12-01 – 2020-12-02 (×2): 10 mg via ORAL
  Filled 2020-11-30 (×4): qty 2

## 2020-11-30 MED ORDER — VANCOMYCIN HCL IN DEXTROSE 1-5 GM/200ML-% IV SOLN
INTRAVENOUS | Status: AC
  Administered 2020-11-30: 1000 mg
  Filled 2020-11-30: qty 200

## 2020-11-30 MED ORDER — SENNOSIDES-DOCUSATE SODIUM 8.6-50 MG PO TABS
1.0000 | ORAL_TABLET | Freq: Every day | ORAL | Status: DC
  Administered 2020-12-01 – 2020-12-04 (×4): 1 via ORAL
  Filled 2020-11-30 (×5): qty 1

## 2020-11-30 MED ORDER — 0.9 % SODIUM CHLORIDE (POUR BTL) OPTIME
TOPICAL | Status: DC | PRN
  Administered 2020-11-30: 2000 mL

## 2020-11-30 MED ORDER — DEXAMETHASONE SODIUM PHOSPHATE 10 MG/ML IJ SOLN
INTRAMUSCULAR | Status: DC | PRN
  Administered 2020-11-30: 5 mg via INTRAVENOUS

## 2020-11-30 MED ORDER — FENTANYL CITRATE (PF) 250 MCG/5ML IJ SOLN
INTRAMUSCULAR | Status: AC
  Filled 2020-11-30: qty 5

## 2020-11-30 MED ORDER — ACETAMINOPHEN 500 MG PO TABS
1000.0000 mg | ORAL_TABLET | Freq: Four times a day (QID) | ORAL | Status: DC
  Administered 2020-11-30 – 2020-12-05 (×17): 1000 mg via ORAL
  Filled 2020-11-30 (×18): qty 2

## 2020-11-30 MED ORDER — OXYCODONE HCL 5 MG PO TABS: 5.0000 mg | ORAL_TABLET | Freq: Once | ORAL | Status: DC | PRN

## 2020-11-30 MED ORDER — SODIUM CHLORIDE FLUSH 0.9 % IV SOLN
INTRAVENOUS | Status: DC | PRN
  Administered 2020-11-30: 100 mL

## 2020-11-30 MED ORDER — ESMOLOL HCL 100 MG/10ML IV SOLN
INTRAVENOUS | Status: DC | PRN
  Administered 2020-11-30: 30 mg via INTRAVENOUS

## 2020-11-30 MED ORDER — ACETAMINOPHEN 160 MG/5ML PO SOLN: 1000.0000 mg | Freq: Four times a day (QID) | ORAL | Status: DC

## 2020-11-30 MED ORDER — MORPHINE SULFATE (PF) 2 MG/ML IV SOLN
2.0000 mg | INTRAVENOUS | Status: DC | PRN
  Administered 2020-11-30 – 2020-12-04 (×5): 2 mg via INTRAVENOUS
  Filled 2020-11-30 (×5): qty 1

## 2020-11-30 MED ORDER — PHENYLEPHRINE HCL-NACL 10-0.9 MG/250ML-% IV SOLN
INTRAVENOUS | Status: DC | PRN
  Administered 2020-11-30: 40 ug/min via INTRAVENOUS

## 2020-11-30 MED ORDER — PHENYLEPHRINE 40 MCG/ML (10ML) SYRINGE FOR IV PUSH (FOR BLOOD PRESSURE SUPPORT)
PREFILLED_SYRINGE | INTRAVENOUS | Status: AC
  Filled 2020-11-30: qty 30

## 2020-11-30 MED ORDER — MIDAZOLAM HCL 5 MG/5ML IJ SOLN
INTRAMUSCULAR | Status: DC | PRN
  Administered 2020-11-30: 1 mg via INTRAVENOUS

## 2020-11-30 MED ORDER — VANCOMYCIN HCL IN DEXTROSE 1-5 GM/200ML-% IV SOLN
INTRAVENOUS | Status: AC
  Filled 2020-11-30: qty 200

## 2020-11-30 MED ORDER — TRAMADOL HCL 50 MG PO TABS
50.0000 mg | ORAL_TABLET | Freq: Four times a day (QID) | ORAL | Status: DC | PRN
  Administered 2020-11-30 – 2020-12-04 (×8): 100 mg via ORAL
  Filled 2020-11-30 (×8): qty 2

## 2020-11-30 MED ORDER — PROPOFOL 10 MG/ML IV BOLUS
INTRAVENOUS | Status: DC | PRN
  Administered 2020-11-30: 40 mg via INTRAVENOUS
  Administered 2020-11-30: 130 mg via INTRAVENOUS

## 2020-11-30 MED ORDER — LIDOCAINE 2% (20 MG/ML) 5 ML SYRINGE
INTRAMUSCULAR | Status: AC
  Filled 2020-11-30: qty 10

## 2020-11-30 MED ORDER — DEXAMETHASONE SODIUM PHOSPHATE 10 MG/ML IJ SOLN
INTRAMUSCULAR | Status: AC
  Filled 2020-11-30: qty 3

## 2020-11-30 MED ORDER — OXYCODONE HCL 5 MG/5ML PO SOLN: 5.0000 mg | Freq: Once | ORAL | Status: DC | PRN

## 2020-11-30 MED ORDER — VANCOMYCIN HCL 1000 MG/200ML IV SOLN: 1000.0000 mg | INTRAVENOUS | Status: DC

## 2020-11-30 MED ORDER — FENTANYL CITRATE (PF) 100 MCG/2ML IJ SOLN: 25.0000 ug | INTRAMUSCULAR | Status: DC | PRN

## 2020-11-30 MED ORDER — ONDANSETRON HCL 4 MG/2ML IJ SOLN
4.0000 mg | Freq: Four times a day (QID) | INTRAMUSCULAR | Status: DC | PRN
  Administered 2020-12-03: 4 mg via INTRAVENOUS
  Filled 2020-11-30: qty 2

## 2020-11-30 MED ORDER — CHLORHEXIDINE GLUCONATE 0.12 % MT SOLN
OROMUCOSAL | Status: AC
  Administered 2020-11-30: 15 mL via OROMUCOSAL
  Filled 2020-11-30: qty 15

## 2020-11-30 MED ORDER — FENTANYL CITRATE (PF) 250 MCG/5ML IJ SOLN
INTRAMUSCULAR | Status: DC | PRN
  Administered 2020-11-30: 100 ug via INTRAVENOUS
  Administered 2020-11-30 (×4): 50 ug via INTRAVENOUS

## 2020-11-30 MED ORDER — ROCURONIUM BROMIDE 10 MG/ML (PF) SYRINGE
PREFILLED_SYRINGE | INTRAVENOUS | Status: AC
  Filled 2020-11-30: qty 30

## 2020-11-30 MED ORDER — INSULIN ASPART 100 UNIT/ML IJ SOLN
0.0000 [IU] | Freq: Three times a day (TID) | INTRAMUSCULAR | Status: DC
  Administered 2020-11-30 – 2020-12-01 (×3): 2 [IU] via SUBCUTANEOUS
  Administered 2020-12-01: 4 [IU] via SUBCUTANEOUS
  Administered 2020-12-04: 2 [IU] via SUBCUTANEOUS

## 2020-11-30 MED ORDER — ENOXAPARIN SODIUM 40 MG/0.4ML IJ SOSY
40.0000 mg | PREFILLED_SYRINGE | Freq: Every day | INTRAMUSCULAR | Status: DC
  Administered 2020-12-01 – 2020-12-05 (×5): 40 mg via SUBCUTANEOUS
  Filled 2020-11-30 (×5): qty 0.4

## 2020-11-30 MED ORDER — BUPIVACAINE LIPOSOME 1.3 % IJ SUSP
INTRAMUSCULAR | Status: AC
  Filled 2020-11-30: qty 20

## 2020-11-30 MED ORDER — ALBUMIN HUMAN 5 % IV SOLN: INTRAVENOUS | Status: DC | PRN

## 2020-11-30 MED ORDER — VANCOMYCIN HCL 1000 MG/200ML IV SOLN
1000.0000 mg | Freq: Two times a day (BID) | INTRAVENOUS | Status: AC
  Administered 2020-11-30: 1000 mg via INTRAVENOUS
  Filled 2020-11-30: qty 200

## 2020-11-30 MED ORDER — BUPIVACAINE HCL (PF) 0.5 % IJ SOLN
INTRAMUSCULAR | Status: AC
  Filled 2020-11-30: qty 30

## 2020-11-30 MED ORDER — ORAL CARE MOUTH RINSE: 15.0000 mL | Freq: Once | OROMUCOSAL | Status: AC

## 2020-11-30 MED ORDER — ONDANSETRON HCL 4 MG/2ML IJ SOLN: 4.0000 mg | Freq: Once | INTRAMUSCULAR | Status: DC | PRN

## 2020-11-30 MED ORDER — PHENYLEPHRINE 40 MCG/ML (10ML) SYRINGE FOR IV PUSH (FOR BLOOD PRESSURE SUPPORT)
PREFILLED_SYRINGE | INTRAVENOUS | Status: DC | PRN
  Administered 2020-11-30: 8 ug via INTRAVENOUS
  Administered 2020-11-30: 120 ug via INTRAVENOUS
  Administered 2020-11-30 (×2): 80 ug via INTRAVENOUS
  Administered 2020-11-30: 120 ug via INTRAVENOUS

## 2020-11-30 MED ORDER — DARUNAVIR-COBICISTAT 800-150 MG PO TABS
1.0000 | ORAL_TABLET | Freq: Every day | ORAL | Status: DC
  Administered 2020-12-01 – 2020-12-05 (×5): 1 via ORAL
  Filled 2020-11-30 (×5): qty 1

## 2020-11-30 MED ORDER — ONDANSETRON HCL 4 MG/2ML IJ SOLN
INTRAMUSCULAR | Status: AC
  Filled 2020-11-30: qty 4

## 2020-11-30 MED ORDER — LIDOCAINE 2% (20 MG/ML) 5 ML SYRINGE
INTRAMUSCULAR | Status: DC | PRN
  Administered 2020-11-30: 40 mg via INTRAVENOUS

## 2020-11-30 MED ORDER — KETOROLAC TROMETHAMINE 30 MG/ML IJ SOLN
30.0000 mg | Freq: Four times a day (QID) | INTRAMUSCULAR | Status: DC | PRN
  Administered 2020-11-30 – 2020-12-04 (×6): 30 mg via INTRAVENOUS
  Filled 2020-11-30 (×6): qty 1

## 2020-11-30 MED ORDER — MIDAZOLAM HCL 2 MG/2ML IJ SOLN
INTRAMUSCULAR | Status: AC
  Filled 2020-11-30: qty 2

## 2020-11-30 MED ORDER — LACTATED RINGERS IV SOLN: INTRAVENOUS | Status: DC

## 2020-11-30 MED ORDER — PROPOFOL 10 MG/ML IV BOLUS
INTRAVENOUS | Status: AC
  Filled 2020-11-30: qty 20

## 2020-11-30 MED ORDER — SUGAMMADEX SODIUM 200 MG/2ML IV SOLN
INTRAVENOUS | Status: DC | PRN
  Administered 2020-11-30: 200 mg via INTRAVENOUS

## 2020-11-30 SURGICAL SUPPLY — 113 items
APPLIER CLIP ROT 10 11.4 M/L (STAPLE)
BLADE CLIPPER SURG (BLADE) IMPLANT
BNDG COHESIVE 6X5 TAN STRL LF (GAUZE/BANDAGES/DRESSINGS) ×2 IMPLANT
CANISTER SUCT 3000ML PPV (MISCELLANEOUS) ×4 IMPLANT
CANNULA REDUC XI 12-8 STAPL (CANNULA) ×1
CANNULA REDUCER 12-8 DVNC XI (CANNULA) ×1 IMPLANT
CATH THORACIC 28FR (CATHETERS) ×2 IMPLANT
CATH THORACIC 28FR RT ANG (CATHETERS) IMPLANT
CATH THORACIC 36FR (CATHETERS) IMPLANT
CATH THORACIC 36FR RT ANG (CATHETERS) IMPLANT
CATH TROCAR 20FR (CATHETERS) IMPLANT
CHLORAPREP W/TINT 26 (MISCELLANEOUS) ×4 IMPLANT
CLEANER TIP ELECTROSURG 2X2 (MISCELLANEOUS) ×2 IMPLANT
CLIP APPLIE ROT 10 11.4 M/L (STAPLE) IMPLANT
CLIP VESOCCLUDE MED 6/CT (CLIP) IMPLANT
CNTNR URN SCR LID CUP LEK RST (MISCELLANEOUS) ×3 IMPLANT
CONN ST 1/4X3/8  BEN (MISCELLANEOUS)
CONN ST 1/4X3/8 BEN (MISCELLANEOUS) IMPLANT
CONT SPEC 4OZ STRL OR WHT (MISCELLANEOUS) ×3
COVER SURGICAL LIGHT HANDLE (MISCELLANEOUS) IMPLANT
DEFOGGER SCOPE WARMER CLEARIFY (MISCELLANEOUS) ×2 IMPLANT
DERMABOND ADVANCED (GAUZE/BANDAGES/DRESSINGS) ×1
DERMABOND ADVANCED .7 DNX12 (GAUZE/BANDAGES/DRESSINGS) ×1 IMPLANT
DISSECTOR BLUNT TIP ENDO 5MM (MISCELLANEOUS) IMPLANT
DRAIN CHANNEL 28F RND 3/8 FF (WOUND CARE) IMPLANT
DRAIN CHANNEL 32F RND 10.7 FF (WOUND CARE) IMPLANT
DRAPE ARM DVNC X/XI (DISPOSABLE) ×4 IMPLANT
DRAPE COLUMN DVNC XI (DISPOSABLE) ×1 IMPLANT
DRAPE CV SPLIT W-CLR ANES SCRN (DRAPES) ×2 IMPLANT
DRAPE DA VINCI XI ARM (DISPOSABLE) ×4
DRAPE DA VINCI XI COLUMN (DISPOSABLE) ×1
DRAPE ORTHO SPLIT 77X108 STRL (DRAPES) ×1
DRAPE SURG ORHT 6 SPLT 77X108 (DRAPES) ×1 IMPLANT
DRAPE WARM FLUID 44X44 (DRAPES) IMPLANT
ELECT BLADE 6.5 EXT (BLADE) IMPLANT
ELECT REM PT RETURN 9FT ADLT (ELECTROSURGICAL) ×2
ELECTRODE REM PT RTRN 9FT ADLT (ELECTROSURGICAL) ×1 IMPLANT
GAUZE SPONGE 4X4 12PLY STRL (GAUZE/BANDAGES/DRESSINGS) ×2 IMPLANT
GLOVE SURG ENC MOIS LTX SZ7 (GLOVE) ×4 IMPLANT
GOWN STRL REUS W/ TWL LRG LVL3 (GOWN DISPOSABLE) ×3 IMPLANT
GOWN STRL REUS W/ TWL XL LVL3 (GOWN DISPOSABLE) ×2 IMPLANT
GOWN STRL REUS W/TWL 2XL LVL3 (GOWN DISPOSABLE) ×2 IMPLANT
GOWN STRL REUS W/TWL LRG LVL3 (GOWN DISPOSABLE) ×3
GOWN STRL REUS W/TWL XL LVL3 (GOWN DISPOSABLE) ×2
HEMOSTAT SURGICEL 2X14 (HEMOSTASIS) IMPLANT
IRRIGATION STRYKERFLOW (MISCELLANEOUS) IMPLANT
IRRIGATOR STRYKERFLOW (MISCELLANEOUS)
KIT BASIN OR (CUSTOM PROCEDURE TRAY) ×2 IMPLANT
KIT SUCTION CATH 14FR (SUCTIONS) IMPLANT
KIT TURNOVER KIT B (KITS) ×2 IMPLANT
LOOP VESSEL SUPERMAXI WHITE (MISCELLANEOUS) IMPLANT
NEEDLE 22X1 1/2 (OR ONLY) (NEEDLE) ×2 IMPLANT
NEEDLE HYPO 25GX1X1/2 BEV (NEEDLE) ×2 IMPLANT
NEEDLE SPNL 22GX3.5 QUINCKE BK (NEEDLE) ×2 IMPLANT
NS IRRIG 1000ML POUR BTL (IV SOLUTION) ×4 IMPLANT
PACK CHEST (CUSTOM PROCEDURE TRAY) ×2 IMPLANT
PAD ARMBOARD 7.5X6 YLW CONV (MISCELLANEOUS) ×10 IMPLANT
PORT ACCESS TROCAR AIRSEAL 12 (TROCAR) ×1 IMPLANT
PORT ACCESS TROCAR AIRSEAL 5M (TROCAR) ×1
RELOAD STAPLER 3.5X45 BLU DVNC (STAPLE) ×10 IMPLANT
RETRACTOR WOUND ALXS 19CM XSML (INSTRUMENTS) IMPLANT
RTRCTR WOUND ALEXIS 19CM XSML (INSTRUMENTS)
SCISSORS LAP 5X35 DISP (ENDOMECHANICALS) IMPLANT
SEAL CANN UNIV 5-8 DVNC XI (MISCELLANEOUS) ×3 IMPLANT
SEAL XI 5MM-8MM UNIVERSAL (MISCELLANEOUS) ×3
SEALANT PROGEL (MISCELLANEOUS) IMPLANT
SEALANT SURG COSEAL 4ML (VASCULAR PRODUCTS) IMPLANT
SEALANT SURG COSEAL 8ML (VASCULAR PRODUCTS) IMPLANT
SEALER LIGASURE MARYLAND 30 (ELECTROSURGICAL) IMPLANT
SET TRI-LUMEN FLTR TB AIRSEAL (TUBING) ×2 IMPLANT
SHEET MEDIUM DRAPE 40X70 STRL (DRAPES) ×2 IMPLANT
SPECIMEN JAR MEDIUM (MISCELLANEOUS) IMPLANT
SPONGE INTESTINAL PEANUT (DISPOSABLE) ×2 IMPLANT
SPONGE TONSIL TAPE 1 RFD (DISPOSABLE) IMPLANT
STAPLER 45 DA VINCI SURE FORM (STAPLE) ×1
STAPLER 45 SUREFORM DVNC (STAPLE) ×1 IMPLANT
STAPLER CANNULA SEAL DVNC XI (STAPLE) ×2 IMPLANT
STAPLER CANNULA SEAL XI (STAPLE) ×2
STAPLER RELOAD 3.5X45 BLU DVNC (STAPLE) ×10
STAPLER RELOAD 3.5X45 BLUE (STAPLE) ×10
STOPCOCK 4 WAY LG BORE MALE ST (IV SETS) ×2 IMPLANT
SUT MNCRL AB 3-0 PS2 18 (SUTURE) ×2 IMPLANT
SUT MON AB 2-0 CT1 36 (SUTURE) IMPLANT
SUT PDS AB 1 CTX 36 (SUTURE) IMPLANT
SUT PROLENE 4 0 RB 1 (SUTURE)
SUT PROLENE 4-0 RB1 .5 CRCL 36 (SUTURE) IMPLANT
SUT SILK  1 MH (SUTURE) ×1
SUT SILK 1 MH (SUTURE) ×1 IMPLANT
SUT SILK 1 TIES 10X30 (SUTURE) IMPLANT
SUT SILK 2 0 SH (SUTURE) IMPLANT
SUT SILK 2 0SH CR/8 30 (SUTURE) IMPLANT
SUT VIC AB 1 CTX 36 (SUTURE)
SUT VIC AB 1 CTX36XBRD ANBCTR (SUTURE) IMPLANT
SUT VIC AB 2-0 CT1 27 (SUTURE) ×2
SUT VIC AB 2-0 CT1 TAPERPNT 27 (SUTURE) ×2 IMPLANT
SUT VIC AB 3-0 SH 27 (SUTURE) ×1
SUT VIC AB 3-0 SH 27X BRD (SUTURE) ×1 IMPLANT
SUT VICRYL 0 TIES 12 18 (SUTURE) ×2 IMPLANT
SUT VICRYL 0 UR6 27IN ABS (SUTURE) ×2 IMPLANT
SUT VICRYL 2 TP 1 (SUTURE) IMPLANT
SYR 10ML LL (SYRINGE) ×2 IMPLANT
SYR 20ML LL LF (SYRINGE) ×2 IMPLANT
SYR 50ML LL SCALE MARK (SYRINGE) ×2 IMPLANT
SYSTEM RETRIEVAL ANCHOR 8 (MISCELLANEOUS) ×2 IMPLANT
SYSTEM SAHARA CHEST DRAIN ATS (WOUND CARE) ×2 IMPLANT
TAPE CLOTH 4X10 WHT NS (GAUZE/BANDAGES/DRESSINGS) ×2 IMPLANT
TAPE CLOTH SURG 4X10 WHT LF (GAUZE/BANDAGES/DRESSINGS) ×2 IMPLANT
TIP APPLICATOR SPRAY EXTEND 16 (VASCULAR PRODUCTS) IMPLANT
TOWEL GREEN STERILE (TOWEL DISPOSABLE) ×2 IMPLANT
TRAY FOLEY MTR SLVR 16FR STAT (SET/KITS/TRAYS/PACK) ×2 IMPLANT
TRAY WAYNE PNEUMOTHORAX 14X18 (TRAY / TRAY PROCEDURE) ×2 IMPLANT
TUBING EXTENTION W/L.L. (IV SETS) ×2 IMPLANT
WATER STERILE IRR 1000ML POUR (IV SOLUTION) ×2 IMPLANT

## 2020-11-30 NOTE — Progress Notes (Signed)
Patient taken to OR via bed at 0915hrs.

## 2020-11-30 NOTE — Progress Notes (Addendum)
      [address]       [name] [address]             [phone]       Subjective:  Patient states he had a rough night.  He is feeling better this morning.  Denies pain, shortness of breath  Objective: Vital signs in last 24 hours: Temp:  [97.7 F (36.5 C)-98.5 F (36.9 C)] 98.3 F (36.8 C) (06/30 0549) Pulse Rate:  [76-91] 89 (06/30 0549) Cardiac Rhythm: Normal sinus rhythm (06/29 2100) Resp:  [17-20] 18 (06/30 0549) BP: (110-132)/(65-84) 114/84 (06/30 0549) SpO2:  [93 %-97 %] 95 % (06/30 0549)  Intake/Output from previous day: 06/29 0701 - 06/30 0700 In: 270 [P.O.:240; I.V.:10] Out: 594 [Urine:590; Chest Tube:4]  General appearance: alert, cooperative, and no distress Heart: regular rate and rhythm Lungs: clear to auscultation bilaterally Wound: clean and dry  Lab Results: Recent Labs    11/29/20 0302 11/30/20 0126  WBC 4.6 4.9  HGB 12.4* 12.6*  HCT 35.8* 35.8*  PLT 155 189   BMET:  Recent Labs    11/29/20 0302 11/30/20 0126  NA 132* 131*  K 3.8 3.8  CL 100 99  CO2 26 23  GLUCOSE 151* 108*  BUN 14 12  CREATININE 1.08 1.02  CALCIUM 9.3 9.3    PT/INR: No results for input(s): LABPROT, INR in the last 72 hours. ABG No results found for: PHART, HCO3, TCO2, ACIDBASEDEF, O2SAT CBG (last 3)  Recent Labs    11/29/20 1201 11/29/20 1640 11/29/20 2022  GLUCAP 178* 106* 191*    Assessment/Plan:  Spontaneous Tension Pneumothorax- CT in place, attempted water seal yesterday, however patient developed repeat near collapse of lung with tension component.... CT back on suction this morning, no air leak is present. DIspo- patient with repeat tension pneumothorax with placement of chest tube on water seal.. back on suction this morning, no air leak present., leave on suction   LOS: 5 days    Lowella Dandy, PA-C 11/30/2020 Patient seen and examined.  Failed water seal, now back on suction Still has an air leak Discussed with patient  proceeding with robotic right VATS for bleb resection for definitive treatment of his spontaneous pneumothorax. We discussed the general nature of the procedure, the need for general anesthesia, the incisions to be used, and the use of a drainage tube postoperatively. We discussed the expected hospital stay, overall recovery and short and long term outcomes. I informed him of the indications, risks, benefits and alternatives.  He understands the risks include, but are not limited to death, stroke, MI, DVT/PE, bleeding, possible need for transfusion, infections,other organ system dysfunction including respiratory, renal, or GI complications.  He accepts the risks and agrees to proceed.   Will get CT chest this AM  Dr. Cliffton Asters is available to do procedure this afternoon

## 2020-11-30 NOTE — Progress Notes (Signed)
Cxray result called in by radiology as follows:  "Interval development of a large right pneumothorax with tension physiology and near complete collapse of the right lung. Right pigtail chest tube remains in place and clinical correlation for appropriate function of the catheter is recommended."   Above result relayed to Dr Margo Aye and Dr Cliffton Asters .R Chest tube connection checked and remain patent. Connected  back to suction ( 20 cm suction ) as per Dr Cliffton Asters , kept NPO per Dr Margo Aye. O2 2l /m [place] in use , denies any SOB .Continue to monitor .

## 2020-11-30 NOTE — Op Note (Signed)
      [address]       [name] [address]             [phone]        11/30/2020  Patient:  Shauna Hugh Pre-Op Dx: Spontaneous pneumothorax   Bullous emphysema   History of HIV Post-op Dx: Same Procedure:  - Robotic assisted right video thoracoscopy - Wedge resection of the upper and lower lobes - Apical pleurectomy - Mechanical pleurodesis - Intercostal nerve block  Surgeon and Role:      * Aryiana Klinkner, Eliezer Lofts, MD - Primary    Margaretha Glassing, PA-C- assisting Anesthesia  general EBL: Minimal  Blood Administration: None Specimen: Right upper lobe wedge resection x3, left lower lobe wedge resection, apical pleura.  Drains: 26 F argyle chest tube in right chest Counts: correct   Indications: 75 yo man with a history of tobacco abuse, HIV and type II diabetes presented with new onset shortness of breath. CXr showed a large right pneumothorax. A pigtail catheter was placed and recently placed to waterseal.  He developed a recurrent pneumothorax with component of tension.  Due to failed conservative management surgery was indicated.  Findings: Multiple adhesions to the upper lobe and lower lobe.  There are bullous changes to the upper lobe apically and at the base.  Operative Technique: After the risks, benefits and alternatives were thoroughly discussed, the patient was brought to the operative theatre.  Anesthesia was induced, and the patient was then placed in a left lateral decubitus position and was prepped and draped in normal sterile fashion.  An appropriate surgical pause was performed, and pre-operative antibiotics were dosed accordingly.  We began by placing our 3 robotic ports in the the 7th intercostal space targeting the hilum of the lung.  The robot was then docked and all instruments were passed under direct visualization.    There were several adhesions that were lysed with Bovie cautery and blunt dissection.  The lung was then retracted  superiorly, and the inferior pulmonary ligament was divided.  Wedge resections of the upper lobe x3, and lower lobe x1 were performed.  The apical parietal pleural was removed with a combination of cautery and blunt dissection.  The chest was irrigated, and an air leak test was performed.  An intercostal nerve block was performed under direct visualization.  A mechanical pleurodesis was then performed.  A 55F chest with then placed, and we watch the remaining lobes re-expand.  The skin and soft tissue were closed with absorbable suture.    The patient tolerated the procedure without any immediate complications, and was transferred to the PACU in stable condition.  Nalin Mazzocco Keane Scrape

## 2020-11-30 NOTE — Progress Notes (Signed)
RT NOTE: RT attempted to obtain ordered ABG but patient is gone from room for CT. RT will obtain ABG later when patient returns.

## 2020-11-30 NOTE — Anesthesia Procedure Notes (Signed)
Arterial Line Insertion Start/End6/30/2022 10:00 AM, 11/30/2020 10:10 AM Performed by: Kipp Brood, MD, Macie Burows, CRNA, CRNA  Patient location: Pre-op. Preanesthetic checklist: patient identified, IV checked, site marked, risks and benefits discussed, surgical consent, monitors and equipment checked, pre-op evaluation, timeout performed and anesthesia consent Lidocaine 1% used for infiltration radial was placed Catheter size: 20 G Hand hygiene performed  and maximum sterile barriers used   Attempts: 1 Procedure performed without using ultrasound guided technique. Following insertion, dressing applied and Biopatch. Post procedure assessment: normal and unchanged  Patient tolerated the procedure well with no immediate complications.

## 2020-11-30 NOTE — Anesthesia Preprocedure Evaluation (Signed)
Anesthesia Evaluation  Patient identified by MRN, date of birth, ID band Patient awake    Reviewed: Allergy & Precautions, NPO status , Patient's Chart, lab work & pertinent test results  Airway Mallampati: II  TM Distance: >3 FB Neck ROM: Full    Dental  (+) Edentulous Upper, Edentulous Lower   Pulmonary Current Smoker and Patient abstained from smoking.,    breath sounds clear to auscultation       Cardiovascular  Rhythm:Regular Rate:Normal     Neuro/Psych    GI/Hepatic   Endo/Other  diabetes  Renal/GU      Musculoskeletal   Abdominal   Peds  Hematology   Anesthesia Other Findings   Reproductive/Obstetrics                             Anesthesia Physical Anesthesia Plan  ASA: 3  Anesthesia Plan: General   Post-op Pain Management:    Induction: Intravenous  PONV Risk Score and Plan: Ondansetron and Dexamethasone  Airway Management Planned: Double Lumen EBT  Additional Equipment: Arterial line  Intra-op Plan:   Post-operative Plan: Extubation in OR  Informed Consent: I have reviewed the patients History and Physical, chart, labs and discussed the procedure including the risks, benefits and alternatives for the proposed anesthesia with the patient or authorized representative who has indicated his/her understanding and acceptance.       Plan Discussed with: Anesthesiologist and CRNA  Anesthesia Plan Comments: (L. Spontaneous pneumothorax HIV well controlled Type 2 DM on metformin)        Anesthesia Quick Evaluation

## 2020-11-30 NOTE — Anesthesia Procedure Notes (Signed)
Procedure Name: Intubation Date/Time: 11/30/2020 2:13 PM Performed by: Imagene Riches, CRNA Pre-anesthesia Checklist: Patient identified, Emergency Drugs available, Suction available and Patient being monitored Patient Re-evaluated:Patient Re-evaluated prior to induction Oxygen Delivery Method: Circle System Utilized Preoxygenation: Pre-oxygenation with 100% oxygen Induction Type: IV induction Ventilation: Oral airway inserted - appropriate to patient size and Two handed mask ventilation required Laryngoscope Size: Mac and 3 Grade View: Grade I Tube type: Oral Endobronchial tube: Left, Double lumen EBT and EBT position confirmed by fiberoptic bronchoscope and 39 Fr Number of attempts: 1 Airway Equipment and Method: Stylet and Oral airway Placement Confirmation: ETT inserted through vocal cords under direct vision, positive ETCO2 and breath sounds checked- equal and bilateral Secured at: 29 cm Tube secured with: Tape Dental Injury: Teeth and Oropharynx as per pre-operative assessment  Comments: Mask ventilation difficulty related to no teeth and large nose. Easy to ventilated with two hand and oral airway

## 2020-11-30 NOTE — Progress Notes (Signed)
PROGRESS NOTE    Chris Jennings  NAT:[id number] DOB: 05-08-1946 DOA: 11/25/2020 PCP: [facility]    Brief Narrative:  75 year old with a history of DM, HIV, and PTSD who presented to the ED with the sudden onset of severe shortness of breath.  Evaluation revealed a right-sided tension pneumothorax.  A chest tube was placed in the ED.  Assessment & Plan:   Principal Problem:   Tension pneumothorax, spontaneous Active Problems:   HIV (human immunodeficiency virus infection) (HCC)   Diabetes (HCC)  Right spontaneous tension pneumothorax Chest tube placed in ED at time of presentation CT surgery following. Noted to have development of repeat near collapse of lung with tension component noted Pt now s/p VATS today Cont plan per CTS   HIV Continue usual home medications   DM2 CBG trends reviewed Stable at this time   BPH Continue usual Proscar  Stable at this time    DVT prophylaxis: SCD's Code Status: Full Family Communication: Pt in room, family not at bedside  Status is: Inpatient  Remains inpatient appropriate because:Inpatient level of care appropriate due to severity of illness  Dispo: The patient is from: Home              Anticipated d/c is to: Home              Patient currently is not medically stable to d/c.   Difficult to place patient No   Consultants:  CT Surgery  Procedures:    Antimicrobials: Anti-infectives (From admission, onward)    Start     Dose/Rate Route Frequency Ordered Stop   12/01/20 1200  darunavir-cobicistat (PREZCOBIX) 800-150 MG per tablet 1 tablet        1 tablet Oral Daily with lunch 11/30/20 1717     11/30/20 2200  vancomycin (VANCOREADY) IVPB 1000 mg/200 mL        1,000 mg 200 mL/hr over 60 Minutes Intravenous Every 12 hours 11/30/20 1717 12/01/20 0959   11/30/20 0941  vancomycin (VANCOCIN) 1-5 GM/200ML-% IVPB       Note to Pharmacy: Randa Ngo   : cabinet override      11/30/20 0941 11/30/20 2144    11/30/20 0915  vancomycin (VANCOCIN) 1-5 GM/200ML-% IVPB       Note to Pharmacy: Randa Ngo   : cabinet override      11/30/20 0915 11/30/20 0953   11/30/20 0830  vancomycin (VANCOREADY) IVPB 1000 mg/200 mL  Status:  Discontinued        1,000 mg 200 mL/hr over 60 Minutes Intravenous To Short Stay 11/30/20 0823 11/30/20 1712   11/26/20 1200  darunavir-cobicistat (PREZCOBIX) 800-150 MG per tablet 1 tablet  Status:  Discontinued        1 tablet Oral Daily with lunch 11/26/20 0856 11/30/20 1717   11/26/20 1000  Dolutegravir-lamiVUDine 50-300 MG TABS 1 tablet  Status:  Discontinued        1 tablet Oral Daily 11/26/20 0856 11/26/20 0907   11/26/20 1000  dolutegravir (TIVICAY) tablet 50 mg  Status:  Discontinued       See Hyperspace for full Linked Orders Report.   50 mg Oral Daily 11/26/20 0910 11/30/20 1717   11/26/20 1000  lamiVUDine (EPIVIR) tablet 300 mg  Status:  Discontinued       See Hyperspace for full Linked Orders Report.   300 mg Oral Daily 11/26/20 0910 11/30/20 1717       Subjective: Without complaints  Objective: Vitals:   11/30/20 1630  11/30/20 1645 11/30/20 1700 11/30/20 1715  BP: 119/79 135/82 (!) 140/97 (!) 158/93  Pulse: 67 74 80 83  Resp: 11 15 16 16   Temp: (!) 97 F (36.1 C)  (!) 97 F (36.1 C) (!) 97.4 F (36.3 C)  TempSrc:    Oral  SpO2: 100% 96% 95% 95%  Weight:      Height:        Intake/Output Summary (Last 24 hours) at 11/30/2020 1722 Last data filed at 11/30/2020 1700 Gross per 24 hour  Intake 260 ml  Output 1120 ml  Net -860 ml    Filed Weights   11/25/20 2154  Weight: 58.1 kg    Examination: General exam: Conversant, in no acute distress Respiratory system: normal chest rise, clear, no audible wheezing Cardiovascular system: regular rhythm, s1-s2 Gastrointestinal system: Nondistended, nontender, pos BS Central nervous system: No seizures, no tremors Extremities: No cyanosis, no joint deformities Skin: No rashes, no  pallor Psychiatry: Affect normal // no auditory hallucinations   Data Reviewed: I have personally reviewed following labs and imaging studies  CBC: Recent Labs  Lab 11/25/20 1624 11/29/20 0302 11/30/20 0126 11/30/20 1529 11/30/20 1601  WBC 6.8 4.6 4.9  --   --   HGB 14.2 12.4* 12.6* 11.6* 11.6*  HCT 42.7 35.8* 35.8* 34.0* 34.0*  MCV 87.1 86.1 84.6  --   --   PLT 265 155 189  --   --     Basic Metabolic Panel: Recent Labs  Lab 11/25/20 1624 11/28/20 0313 11/29/20 0302 11/30/20 0126 11/30/20 0928 11/30/20 1529 11/30/20 1601  NA 134* 133* 132* 131* 133* 135 135  K 4.2 4.3 3.8 3.8 4.2 3.7 4.1  CL 98 100 100 99 103  --   --   CO2 23 25 26 23  20*  --   --   GLUCOSE 114* 100* 151* 108* 126*  --   --   BUN 14 12 14 12 11   --   --   CREATININE 1.39* 1.23 1.08 1.02 0.97  --   --   CALCIUM 9.4 9.2 9.3 9.3 9.1  --   --     GFR: Estimated Creatinine Clearance: 54.9 mL/min (by C-G formula based on SCr of 0.97 mg/dL). Liver Function Tests: Recent Labs  Lab 11/30/20 0928  AST 24  ALT 16  ALKPHOS 51  BILITOT 0.9  PROT 6.9  ALBUMIN 3.0*   No results for input(s): LIPASE, AMYLASE in the last 168 hours. No results for input(s): AMMONIA in the last 168 hours. Coagulation Profile: Recent Labs  Lab 11/30/20 0928  INR 1.0   Cardiac Enzymes: No results for input(s): CKTOTAL, CKMB, CKMBINDEX, TROPONINI in the last 168 hours. BNP (last 3 results) No results for input(s): PROBNP in the last 8760 hours. HbA1C: No results for input(s): HGBA1C in the last 72 hours. CBG: Recent Labs  Lab 11/29/20 1640 11/29/20 2022 11/30/20 0812 11/30/20 1013 11/30/20 1632  GLUCAP 106* 191* 132* 142* 138*    Lipid Profile: No results for input(s): CHOL, HDL, LDLCALC, TRIG, CHOLHDL, LDLDIRECT in the last 72 hours. Thyroid Function Tests: No results for input(s): TSH, T4TOTAL, FREET4, T3FREE, THYROIDAB in the last 72 hours. Anemia Panel: No results for input(s): VITAMINB12, FOLATE,  FERRITIN, TIBC, IRON, RETICCTPCT in the last 72 hours. Sepsis Labs: No results for input(s): PROCALCITON, LATICACIDVEN in the last 168 hours.  Recent Results (from the past 240 hour(s))  SARS CORONAVIRUS 2 (TAT 6-24 HRS) Nasopharyngeal Nasopharyngeal Swab  Status: None   Collection Time: 11/25/20  7:06 PM   Specimen: Nasopharyngeal Swab  Result Value Ref Range Status   SARS Coronavirus 2 NEGATIVE NEGATIVE Final    Comment: (NOTE) SARS-CoV-2 target nucleic acids are NOT DETECTED.  The SARS-CoV-2 RNA is generally detectable in upper and lower respiratory specimens during the acute phase of infection. Negative results do not preclude SARS-CoV-2 infection, do not rule out co-infections with other pathogens, and should not be used as the sole basis for treatment or other patient management decisions. Negative results must be combined with clinical observations, patient history, and epidemiological information. The expected result is Negative.  Fact Sheet for Patients: HairSlick.no  Fact Sheet for Healthcare Providers: quierodirigir.com  This test is not yet approved or cleared by the [place] FDA and  has been authorized for detection and/or diagnosis of SARS-CoV-2 by FDA under an Emergency Use Authorization (EUA). This EUA will remain  in effect (meaning this test can be used) for the duration of the COVID-19 declaration under Se ction 564(b)(1) of the Act, 21 U.S.C. section 360bbb-3(b)(1), unless the authorization is terminated or revoked sooner.  Performed at [facility], [address] [place], [place] [address]   Surgical pcr screen     Status: None   Collection Time: 11/30/20 11:04 AM   Specimen: Nasal Mucosa; Nasal Swab  Result Value Ref Range Status   MRSA, PCR NEGATIVE NEGATIVE Final   Staphylococcus aureus NEGATIVE NEGATIVE Final    Comment: (NOTE) The Xpert SA Assay (FDA approved for NASAL  specimens in patients 46 years of age and older), is one component of a comprehensive surveillance program. It is not intended to diagnose infection nor to guide or monitor treatment. Performed at [facility], [address], [place], [place] [address]       Radiology Studies: CT CHEST WO CONTRAST  Result Date: 11/30/2020 CLINICAL DATA:  Pneumothorax, status post chest tube placement, evaluate for blebs EXAM: CT CHEST WITHOUT CONTRAST TECHNIQUE: Multidetector CT imaging of the chest was performed following the standard protocol without IV contrast. COMPARISON:  None. FINDINGS: Cardiovascular: Aortic atherosclerosis. Dense aortic valve calcifications. Normal heart size. Three-vessel coronary artery calcifications no pericardial effusion. Mediastinum/Nodes: No enlarged mediastinal, hilar, or axillary lymph nodes. Thyroid gland, trachea, and esophagus demonstrate no significant findings. Lungs/Pleura: Small to moderate right hydropneumothorax with pigtail chest tube about the lateral right pleural space. There are small biapical blebs, more numerous on the right (series 4, image 31). Scattered tiny centrilobular nodules, most concentrated in the lung apices. Diffuse bilateral bronchial wall thickening. Upper Abdomen: No acute abnormality. Coarse, nodular contour of the liver in the included upper abdomen. Musculoskeletal: No chest wall mass or suspicious bone lesions identified. Age indeterminate superior endplate deformities of T12 and L1 (series 7, image 78). IMPRESSION: 1. Small to moderate right hydropneumothorax with pigtail chest tube about the lateral right pleural space. 2. There are small biapical blebs, more numerous on the right. 3. Dense aortic valve calcifications. Correlate for echocardiographic evidence of aortic valve dysfunction. 4. Coronary artery disease. 5. Coarse, nodular contour of the liver in the included upper abdomen, suggestive of cirrhosis. Correlate with biochemical  findings. 6. Age indeterminate superior endplate deformities of T12 and L1. Correlate for referable pain and point tenderness. Aortic Atherosclerosis (ICD10-I70.0). Electronically Signed   By: Lauralyn Primes M.D.   On: 11/30/2020 09:27   DG CHEST PORT 1 VIEW  Result Date: 11/30/2020 CLINICAL DATA:  Pneumothorax EXAM: PORTABLE CHEST 1  VIEW COMPARISON:  11/29/2020 FINDINGS: Right pigtail chest tube overlies the lateral mid right pleural space. Right pneumothorax, however, has enlarged with near complete collapse of the right lung and some degree of tension with mediastinal shift to the left and depression of the right hemidiaphragm. Left lung is clear. No pneumothorax or pleural effusion on the left. Cardiac size within normal limits. Pulmonary vascularity is normal. Multiple healed bilateral rib fractures are noted. No acute bone abnormality. IMPRESSION: Interval development of a large right pneumothorax with tension physiology and near complete collapse of the right lung. Right pigtail chest tube remains in place and clinical correlation for appropriate function of the catheter is recommended. These results will be called to the ordering clinician or representative by the Radiologist Assistant, and communication documented in the PACS or [employer]. Electronically Signed   By: Helyn Numbers MD   On: 11/30/2020 05:32   DG Chest Port 1 View  Result Date: 11/29/2020 CLINICAL DATA:  Pneumothorax.  Chest tube. EXAM: PORTABLE CHEST 1 VIEW COMPARISON:  11/28/2020. FINDINGS: Right chest tube in stable position. Stable right apical pneumothorax. A small right basilar component also noted. No focal infiltrate. Heart size stable. Old left rib fractures again noted. IMPRESSION: Right chest tube in stable position. Stable small right apical pneumothorax. Small right basilar pneumothorax component also noted. Electronically Signed   By: Maisie Fus  Register   On: 11/29/2020 05:36    Scheduled Meds:  acetaminophen   1,000 mg Oral Q6H   Or   acetaminophen (TYLENOL) oral liquid 160 mg/5 mL  1,000 mg Oral Q6H   bisacodyl  10 mg Oral Daily   [START ON 12/01/2020] darunavir-cobicistat  1 tablet Oral Q lunch   [START ON 12/01/2020] enoxaparin (LOVENOX) injection  40 mg Subcutaneous Daily   insulin aspart  0-24 Units Subcutaneous TID AC & HS   senna-docusate  1 tablet Oral QHS   Continuous Infusions:  vancomycin     vancomycin       LOS: 5 days   Rickey Barbara, MD [facility] Pager On Amion  If 7PM-7AM, please contact night-coverage 11/30/2020, 5:22 PM

## 2020-11-30 NOTE — Progress Notes (Addendum)
Pt admitted to rm 18 from PACU. Initiated tele. CHG wipe given. Oriented pt to the unit. Call bell within reach.   Lawson Radar, RN

## 2020-11-30 NOTE — Transfer of Care (Signed)
Immediate Anesthesia Transfer of Care Note  Patient: Chris Jennings  Procedure(s) Performed: XI ROBOTIC ASSISTED THORASCOPY, WEDGE RESECTION, MECHANICAL PLEURADESIS; APICAL PLEURECTOMY (Right: Chest) INTERCOSTAL NERVE BLOCK (Right: Chest)  Patient Location: PACU  Anesthesia Type:General  Level of Consciousness: drowsy  Airway & Oxygen Therapy: Patient Spontanous Breathing and Patient connected to face mask oxygen  Post-op Assessment: Report given to RN and Post -op Vital signs reviewed and stable  Post vital signs: Reviewed and stable  Last Vitals:  Vitals Value Taken Time  BP 119/79 11/30/20 1631  Temp 36.1 C 11/30/20 1630  Pulse 115 11/30/20 1631  Resp 12 11/30/20 1633  SpO2 87 % 11/30/20 1631  Vitals shown include unvalidated device data.  Last Pain:  Vitals:   11/30/20 0920  TempSrc: Oral  PainSc: 0-No pain      Patients Stated Pain Goal: 0 (11/30/20 0824)  Complications: No notable events documented.

## 2020-11-30 NOTE — Progress Notes (Signed)
Patient taken to Radiology for STAT CT via bed.

## 2020-11-30 NOTE — Brief Op Note (Signed)
11/25/2020 - 11/30/2020  1:14 PM  PATIENT:  Chris Jennings  75 y.o. male  PRE-OPERATIVE DIAGNOSIS:  SP Pneumothorax  POST-OPERATIVE DIAGNOSIS:  SP Pneumothorax  PROCEDURE:  Procedure(s): XI ROBOTIC ASSISTED THORASCOPY, WEDGE RESECTION, MECHANICAL PLEURADESIS; APICAL PLEURECTOMY (Right) INTERCOSTAL NERVE BLOCK (Right)  SURGEON:  Surgeon(s) and Role:    * Lightfoot, Eliezer Lofts, MD - Primary  PHYSICIAN ASSISTANT:   Jari Favre, PA-C Myron Roddenberry, PA-C  ANESTHESIA:   general  EBL:  100 mL   BLOOD ADMINISTERED:none  DRAINS:  ROUTINE    LOCAL MEDICATIONS USED:  BUPIVICAINE   SPECIMEN:  Source of Specimen:  APICAL BLEB, WEDGE RESECTIONS  DISPOSITION OF SPECIMEN:  PATHOLOGY  COUNTS:  YES   DICTATION: .Dragon Dictation  PLAN OF CARE: Admit to inpatient   PATIENT DISPOSITION:  PACU - hemodynamically stable.   Delay start of Pharmacological VTE agent (>24hrs) due to surgical blood loss or risk of bleeding: yes

## 2020-11-30 NOTE — Progress Notes (Addendum)
   11/30/20 2252  Assess: MEWS Score  BP (!) 126/94  Pulse Rate 95  ECG Heart Rate 95  Resp 20  SpO2 97 %  Assess: MEWS Score  MEWS Temp 0  MEWS Systolic 0  MEWS Pulse 0  MEWS RR 0  MEWS LOC 0  MEWS Score 0  MEWS Score Color Green  Treat  Pain Scale 0-10  Pain Score 10  Pain Type Surgical pain  Pain Location Chest  Pain Orientation Right  Pain Descriptors / Indicators Throbbing  Pain Frequency Constant  Pain Onset On-going  Pain Intervention(s) Medication (See eMAR)  Notify: Provider  Provider Hart Carwin MD  Date Provider Notified 11/30/20  Time Provider Notified 2240  Notification Type Page  Notification Reason Other (Comment) (Patient c/o severe pain around his right shoulder area)  Provider response See new orders  Date of Provider Response 11/30/20  Time of Provider Response 2251  Patient c/o of severe constant throbbing pain around his right shoulder blade area, he rated his pain as 10/10, tramadol 100mg  tablet given, toradol 30mg  IV given, patient denies any relief of pain,  MD paged, verbal order received for morphine 2mg  IV every hour as needed for severe pain, will continue to monitor.

## 2020-12-01 ENCOUNTER — Inpatient Hospital Stay (HOSPITAL_COMMUNITY): Payer: Medicare PPO

## 2020-12-01 ENCOUNTER — Encounter (HOSPITAL_COMMUNITY): Payer: Self-pay | Admitting: Thoracic Surgery (Cardiothoracic Vascular Surgery)

## 2020-12-01 DIAGNOSIS — J939 Pneumothorax, unspecified: Secondary | ICD-10-CM

## 2020-12-01 LAB — BLOOD GAS, ARTERIAL
Acid-base deficit: 0.7 mmol/L (ref 0.0–2.0)
Bicarbonate: 23.6 mmol/L (ref 20.0–28.0)
Drawn by: 33099
FIO2: 21
O2 Saturation: 95.3 %
Patient temperature: 37
pCO2 arterial: 40 mmHg (ref 32.0–48.0)
pH, Arterial: 7.388 (ref 7.350–7.450)
pO2, Arterial: 77.2 mmHg — ABNORMAL LOW (ref 83.0–108.0)

## 2020-12-01 LAB — GLUCOSE, CAPILLARY
Glucose-Capillary: 154 mg/dL — ABNORMAL HIGH (ref 70–99)
Glucose-Capillary: 192 mg/dL — ABNORMAL HIGH (ref 70–99)
Glucose-Capillary: 76 mg/dL (ref 70–99)
Glucose-Capillary: 128 mg/dL — ABNORMAL HIGH (ref 70–99)

## 2020-12-01 LAB — BASIC METABOLIC PANEL
Anion gap: 7 (ref 5–15)
BUN: 17 mg/dL (ref 8–23)
CO2: 26 mmol/L (ref 22–32)
Calcium: 8.7 mg/dL — ABNORMAL LOW (ref 8.9–10.3)
Chloride: 98 mmol/L (ref 98–111)
Creatinine, Ser: 1.19 mg/dL (ref 0.61–1.24)
GFR, Estimated: >60 mL/min (ref >60)
Glucose, Bld: 190 mg/dL — ABNORMAL HIGH (ref 70–99)
Potassium: 4.3 mmol/L (ref 3.5–5.1)
Sodium: 131 mmol/L — ABNORMAL LOW (ref 135–145)

## 2020-12-01 LAB — CBC
HCT: 33.9 % — ABNORMAL LOW (ref 39.0–52.0)
Hemoglobin: 11.4 g/dL — ABNORMAL LOW (ref 13.0–17.0)
MCH: 29.4 pg (ref 26.0–34.0)
MCHC: 33.6 g/dL (ref 30.0–36.0)
MCV: 87.4 fL (ref 80.0–100.0)
Platelets: 216 10*3/uL (ref 150–400)
RBC: 3.88 MIL/uL — ABNORMAL LOW (ref 4.22–5.81)
RDW: 14.3 % (ref 11.5–15.5)
WBC: 6.3 10*3/uL (ref 4.0–10.5)
nRBC: 0 % (ref 0.0–0.2)

## 2020-12-01 MED ORDER — LAMIVUDINE 150 MG PO TABS
300.0000 mg | ORAL_TABLET | Freq: Every day | ORAL | Status: DC
  Administered 2020-12-01 – 2020-12-02 (×2): 300 mg via ORAL
  Filled 2020-12-01 (×2): qty 2

## 2020-12-01 NOTE — Progress Notes (Signed)
PROGRESS NOTE    Chris Jennings  NWG:[id number] DOB: 1946/02/11 DOA: 11/25/2020 PCP: [facility]    Brief Narrative:  75 year old with a history of DM, HIV, and PTSD who presented to the ED with the sudden onset of severe shortness of breath.  Evaluation revealed a right-sided tension pneumothorax.  A chest tube was placed in the ED.  Assessment & Plan:   Principal Problem:   Tension pneumothorax, spontaneous Active Problems:   HIV (human immunodeficiency virus infection) (HCC)   Diabetes (HCC)  Right spontaneous tension pneumothorax Chest tube placed in ED at time of presentation CT surgery following. Noted to have development of repeat near collapse of lung with tension component noted, s/p VATS 6/30 Chest tube remains in place Cont plan per CTS   HIV Continue usual home medications   DM2 CBG trends reviewed Stable at this time   BPH Continue usual Proscar  Remains stable at this time    DVT prophylaxis: SCD's Code Status: Full Family Communication: Pt in room, family not at bedside  Status is: Inpatient  Remains inpatient appropriate because:Inpatient level of care appropriate due to severity of illness  Dispo: The patient is from: Home              Anticipated d/c is to: Home              Patient currently is not medically stable to d/c.   Difficult to place patient No   Consultants:  CT Surgery  Procedures:    Antimicrobials: Anti-infectives (From admission, onward)    Start     Dose/Rate Route Frequency Ordered Stop   12/01/20 1445  lamiVUDine (EPIVIR) tablet 300 mg        300 mg Oral Daily 12/01/20 1359     12/01/20 1200  darunavir-cobicistat (PREZCOBIX) 800-150 MG per tablet 1 tablet        1 tablet Oral Daily with lunch 11/30/20 1717     11/30/20 2200  vancomycin (VANCOREADY) IVPB 1000 mg/200 mL        1,000 mg 200 mL/hr over 60 Minutes Intravenous Every 12 hours 11/30/20 1717 11/30/20 2148   11/30/20 0941  vancomycin (VANCOCIN)  1-5 GM/200ML-% IVPB       Note to Pharmacy: Randa Ngo   : cabinet override      11/30/20 0941 11/30/20 2144   11/30/20 0915  vancomycin (VANCOCIN) 1-5 GM/200ML-% IVPB       Note to Pharmacy: Randa Ngo   : cabinet override      11/30/20 0915 11/30/20 0953   11/30/20 0830  vancomycin (VANCOREADY) IVPB 1000 mg/200 mL  Status:  Discontinued        1,000 mg 200 mL/hr over 60 Minutes Intravenous To Short Stay 11/30/20 0823 11/30/20 1712   11/26/20 1200  darunavir-cobicistat (PREZCOBIX) 800-150 MG per tablet 1 tablet  Status:  Discontinued        1 tablet Oral Daily with lunch 11/26/20 0856 11/30/20 1717   11/26/20 1000  Dolutegravir-lamiVUDine 50-300 MG TABS 1 tablet  Status:  Discontinued        1 tablet Oral Daily 11/26/20 0856 11/26/20 0907   11/26/20 1000  dolutegravir (TIVICAY) tablet 50 mg  Status:  Discontinued       See Hyperspace for full Linked Orders Report.   50 mg Oral Daily 11/26/20 0910 11/30/20 1717   11/26/20 1000  lamiVUDine (EPIVIR) tablet 300 mg  Status:  Discontinued       See Hyperspace for full  Linked Orders Report.   300 mg Oral Daily 11/26/20 0910 11/30/20 1717       Subjective: Feels tired, reports not sleeping well last night  Objective: Vitals:   12/01/20 0600 12/01/20 0733 12/01/20 1115 12/01/20 1155  BP: (!) 103/91 98/62  113/72  Pulse: 76 74 62 75  Resp: 15 20 16 19   Temp:  (!) 97.5 F (36.4 C)  98.4 F (36.9 C)  TempSrc:  Oral  Oral  SpO2: 98% 100% 91% 99%  Weight:      Height:        Intake/Output Summary (Last 24 hours) at 12/01/2020 1549 Last data filed at 12/01/2020 02/01/2021 Gross per 24 hour  Intake 240 ml  Output 545 ml  Net -305 ml    Filed Weights   11/25/20 2154  Weight: 58.1 kg    Examination: General exam: Awake, laying in bed, in nad Respiratory system: Normal respiratory effort, no wheezing, chest tube in place Cardiovascular system: regular rate, s1, s2 Gastrointestinal system: Soft, nondistended, positive  BS Central nervous system: CN2-12 grossly intact, strength intact Extremities: Perfused, no clubbing Skin: Normal skin turgor, no notable skin lesions seen Psychiatry: Mood normal // no visual hallucinations   Data Reviewed: I have personally reviewed following labs and imaging studies  CBC: Recent Labs  Lab 11/25/20 1624 11/29/20 0302 11/30/20 0126 11/30/20 1529 11/30/20 1601 12/01/20 0128  WBC 6.8 4.6 4.9  --   --  6.3  HGB 14.2 12.4* 12.6* 11.6* 11.6* 11.4*  HCT 42.7 35.8* 35.8* 34.0* 34.0* 33.9*  MCV 87.1 86.1 84.6  --   --  87.4  PLT 265 155 189  --   --  216    Basic Metabolic Panel: Recent Labs  Lab 11/28/20 0313 11/29/20 0302 11/30/20 0126 11/30/20 0928 11/30/20 1529 11/30/20 1601 12/01/20 0128  NA 133* 132* 131* 133* 135 135 131*  K 4.3 3.8 3.8 4.2 3.7 4.1 4.3  CL 100 100 99 103  --   --  98  CO2 25 26 23  20*  --   --  26  GLUCOSE 100* 151* 108* 126*  --   --  190*  BUN 12 14 12 11   --   --  17  CREATININE 1.23 1.08 1.02 0.97  --   --  1.19  CALCIUM 9.2 9.3 9.3 9.1  --   --  8.7*    GFR: Estimated Creatinine Clearance: 44.8 mL/min (by C-G formula based on SCr of 1.19 mg/dL). Liver Function Tests: Recent Labs  Lab 11/30/20 0928  AST 24  ALT 16  ALKPHOS 51  BILITOT 0.9  PROT 6.9  ALBUMIN 3.0*    No results for input(s): LIPASE, AMYLASE in the last 168 hours. No results for input(s): AMMONIA in the last 168 hours. Coagulation Profile: Recent Labs  Lab 11/30/20 0928  INR 1.0    Cardiac Enzymes: No results for input(s): CKTOTAL, CKMB, CKMBINDEX, TROPONINI in the last 168 hours. BNP (last 3 results) No results for input(s): PROBNP in the last 8760 hours. HbA1C: No results for input(s): HGBA1C in the last 72 hours. CBG: Recent Labs  Lab 11/30/20 1013 11/30/20 1632 11/30/20 2056 12/01/20 0605 12/01/20 1153  GLUCAP 142* 138* 148* 154* 192*    Lipid Profile: No results for input(s): CHOL, HDL, LDLCALC, TRIG, CHOLHDL, LDLDIRECT in the  last 72 hours. Thyroid Function Tests: No results for input(s): TSH, T4TOTAL, FREET4, T3FREE, THYROIDAB in the last 72 hours. Anemia Panel: No results for input(s): VITAMINB12,  FOLATE, FERRITIN, TIBC, IRON, RETICCTPCT in the last 72 hours. Sepsis Labs: No results for input(s): PROCALCITON, LATICACIDVEN in the last 168 hours.  Recent Results (from the past 240 hour(s))  SARS CORONAVIRUS 2 (TAT 6-24 HRS) Nasopharyngeal Nasopharyngeal Swab     Status: None   Collection Time: 11/25/20  7:06 PM   Specimen: Nasopharyngeal Swab  Result Value Ref Range Status   SARS Coronavirus 2 NEGATIVE NEGATIVE Final    Comment: (NOTE) SARS-CoV-2 target nucleic acids are NOT DETECTED.  The SARS-CoV-2 RNA is generally detectable in upper and lower respiratory specimens during the acute phase of infection. Negative results do not preclude SARS-CoV-2 infection, do not rule out co-infections with other pathogens, and should not be used as the sole basis for treatment or other patient management decisions. Negative results must be combined with clinical observations, patient history, and epidemiological information. The expected result is Negative.  Fact Sheet for Patients: HairSlick.no  Fact Sheet for Healthcare Providers: quierodirigir.com  This test is not yet approved or cleared by the [place] FDA and  has been authorized for detection and/or diagnosis of SARS-CoV-2 by FDA under an Emergency Use Authorization (EUA). This EUA will remain  in effect (meaning this test can be used) for the duration of the COVID-19 declaration under Se ction 564(b)(1) of the Act, 21 U.S.C. section 360bbb-3(b)(1), unless the authorization is terminated or revoked sooner.  Performed at [facility], [address], [place], [place] [address]   Surgical pcr screen     Status: None   Collection Time: 11/30/20 11:04 AM   Specimen: Nasal Mucosa; Nasal Swab   Result Value Ref Range Status   MRSA, PCR NEGATIVE NEGATIVE Final   Staphylococcus aureus NEGATIVE NEGATIVE Final    Comment: (NOTE) The Xpert SA Assay (FDA approved for NASAL specimens in patients 83 years of age and older), is one component of a comprehensive surveillance program. It is not intended to diagnose infection nor to guide or monitor treatment. Performed at [facility], [address], [place], [place] [address]       Radiology Studies: CT CHEST WO CONTRAST  Result Date: 11/30/2020 CLINICAL DATA:  Pneumothorax, status post chest tube placement, evaluate for blebs EXAM: CT CHEST WITHOUT CONTRAST TECHNIQUE: Multidetector CT imaging of the chest was performed following the standard protocol without IV contrast. COMPARISON:  None. FINDINGS: Cardiovascular: Aortic atherosclerosis. Dense aortic valve calcifications. Normal heart size. Three-vessel coronary artery calcifications no pericardial effusion. Mediastinum/Nodes: No enlarged mediastinal, hilar, or axillary lymph nodes. Thyroid gland, trachea, and esophagus demonstrate no significant findings. Lungs/Pleura: Small to moderate right hydropneumothorax with pigtail chest tube about the lateral right pleural space. There are small biapical blebs, more numerous on the right (series 4, image 31). Scattered tiny centrilobular nodules, most concentrated in the lung apices. Diffuse bilateral bronchial wall thickening. Upper Abdomen: No acute abnormality. Coarse, nodular contour of the liver in the included upper abdomen. Musculoskeletal: No chest wall mass or suspicious bone lesions identified. Age indeterminate superior endplate deformities of T12 and L1 (series 7, image 78). IMPRESSION: 1. Small to moderate right hydropneumothorax with pigtail chest tube about the lateral right pleural space. 2. There are small biapical blebs, more numerous on the right. 3. Dense aortic valve calcifications. Correlate for echocardiographic evidence of  aortic valve dysfunction. 4. Coronary artery disease. 5. Coarse, nodular contour of the liver in the included upper abdomen, suggestive of cirrhosis. Correlate with biochemical findings. 6. Age indeterminate superior endplate deformities of T12  and L1. Correlate for referable pain and point tenderness. Aortic Atherosclerosis (ICD10-I70.0). Electronically Signed   By: Lauralyn PrimesAlex  Bibbey M.D.   On: 11/30/2020 09:27   DG Chest Port 1 View  Result Date: 12/01/2020 CLINICAL DATA:  Chest tube placement EXAM: PORTABLE CHEST 1 VIEW COMPARISON:  11/30/2020 FINDINGS: Right large bore chest tube is unchanged with its tip at the right apex. Mild parenchymal scarring within the right mid lung zone secondary to wedge resection. Right basilar pulmonary infiltrate has resolved. No pneumothorax or pleural effusion. Left lung is clear. Thoracic aorta is tortuous, but unchanged. Cardiac size within normal limits. Pulmonary vascularity is normal. Multiple healed left rib fractures are identified. IMPRESSION: Right chest tube in place.  No pneumothorax. Resolved right basilar pulmonary infiltrate. Status post right lung wedge resection. Electronically Signed   By: Helyn NumbersAshesh  Parikh MD   On: 12/01/2020 06:39   DG Chest Port 1 View  Result Date: 11/30/2020 CLINICAL DATA:  Postprocedure EXAM: PORTABLE CHEST 1 VIEW COMPARISON:  Radiograph 11/29/2020, 11/30/2020 FINDINGS: Removal of a previously seen pigtail catheter drain and interval placement of and right apically directed large-bore chest tube with multiple lucent side ports. Re-expansion of the right lung with some residual peripheral pneumothorax. Surgical material towards the right hilum and periphery of the right lung compatible with wedge resections of the upper and lower lobes. Some patchy opacity in the right lung base. Left lung is clear. Cardiomediastinal contours are unremarkable. No acute osseous or soft tissue abnormality. Remote left-sided rib fractures are unchanged from  prior. IMPRESSION: Trace residual lateral pneumothorax despite the presence of a large bore apically directed right chest tube. Postsurgical changes in the right lung compatible with recent wedge resections. Nonspecific patchy opacity in the right lung base, possibly postoperative, atelectasis, airspace disease versus aspiration. Electronically Signed   By: Kreg ShropshirePrice  DeHay M.D.   On: 11/30/2020 19:08   DG CHEST PORT 1 VIEW  Result Date: 11/30/2020 CLINICAL DATA:  Pneumothorax EXAM: PORTABLE CHEST 1 VIEW COMPARISON:  11/29/2020 FINDINGS: Right pigtail chest tube overlies the lateral mid right pleural space. Right pneumothorax, however, has enlarged with near complete collapse of the right lung and some degree of tension with mediastinal shift to the left and depression of the right hemidiaphragm. Left lung is clear. No pneumothorax or pleural effusion on the left. Cardiac size within normal limits. Pulmonary vascularity is normal. Multiple healed bilateral rib fractures are noted. No acute bone abnormality. IMPRESSION: Interval development of a large right pneumothorax with tension physiology and near complete collapse of the right lung. Right pigtail chest tube remains in place and clinical correlation for appropriate function of the catheter is recommended. These results will be called to the ordering clinician or representative by the Radiologist Assistant, and communication documented in the PACS or [employer] Dashboard. Electronically Signed   By: Helyn NumbersAshesh  Parikh MD   On: 11/30/2020 05:32    Scheduled Meds:  acetaminophen  1,000 mg Oral Q6H   Or   acetaminophen (TYLENOL) oral liquid 160 mg/5 mL  1,000 mg Oral Q6H   bisacodyl  10 mg Oral Daily   darunavir-cobicistat  1 tablet Oral Q lunch   enoxaparin (LOVENOX) injection  40 mg Subcutaneous Daily   insulin aspart  0-24 Units Subcutaneous TID AC & HS   lamiVUDine  300 mg Oral Daily   senna-docusate  1 tablet Oral QHS   Continuous Infusions:    LOS: 6  days   Rickey BarbaraStephen Paizlie Klaus, MD [facility] Pager On Amion  If  7PM-7AM, please contact night-coverage 12/01/2020, 3:49 PM

## 2020-12-01 NOTE — Progress Notes (Addendum)
[address]       [name] [address]             [phone]      1 Day Post-Op Procedure(s) (LRB): XI ROBOTIC ASSISTED THORASCOPY, WEDGE RESECTION, MECHANICAL PLEURADESIS; APICAL PLEURECTOMY (Right) INTERCOSTAL NERVE BLOCK (Right)  Subjective:  Patient had a rough night.  He was able to sleep much due to pain in his shoulder blade.  He is anxious about hearing about his lung.  I explained to patient that his chest tube can cause irritation with pain radiating to his shoulder blade.   Also concerned about being numb along his right side, relieved patients concern explaining about his nerve block performed during procedure  Objective: Vital signs in last 24 hours: Temp:  [97 F (36.1 C)-98.3 F (36.8 C)] 97.8 F (36.6 C) (07/01 0517) Pulse Rate:  [67-98] 76 (07/01 0600) Cardiac Rhythm: Normal sinus rhythm (06/30 1902) Resp:  [11-21] 15 (07/01 0600) BP: (97-158)/(58-97) 103/91 (07/01 0600) SpO2:  [94 %-100 %] 98 % (07/01 0600) Arterial Line BP: (114-138)/(60-66) 138/66 (06/30 1645)  Intake/Output from previous day: 06/30 0701 - 07/01 0700 In: 490 [P.O.:240; IV Piggyback:250] Out: 795 [Urine:350; Blood:100; Chest Tube:345]  General appearance: alert, cooperative, and no distress Heart: regular rate and rhythm Lungs: clear to auscultation bilaterally Abdomen: soft, non-tender; bowel sounds normal; no masses,  no organomegaly Extremities: extremities normal, atraumatic, no cyanosis or edema Wound: clean and dry  Lab Results: Recent Labs    11/30/20 0126 11/30/20 1529 11/30/20 1601 12/01/20 0128  WBC 4.9  --   --  6.3  HGB 12.6*   < > 11.6* 11.4*  HCT 35.8*   < > 34.0* 33.9*  PLT 189  --   --  216   < > = values in this interval not displayed.   BMET:  Recent Labs    11/30/20 0928 11/30/20 1529 11/30/20 1601 12/01/20 0128  NA 133*   < > 135 131*  K 4.2   < > 4.1 4.3  CL 103  --   --  98  CO2 20*  --   --  26  GLUCOSE 126*  --   --   190*  BUN 11  --   --  17  CREATININE 0.97  --   --  1.19  CALCIUM 9.1  --   --  8.7*   < > = values in this interval not displayed.    PT/INR:  Recent Labs    11/30/20 0928  LABPROT 13.2  INR 1.0   ABG    Component Value Date/Time   PHART 7.388 12/01/2020 0430   HCO3 23.6 12/01/2020 0430   TCO2 26 11/30/2020 1601   ACIDBASEDEF 0.7 12/01/2020 0430   O2SAT 95.3 12/01/2020 0430   CBG (last 3)  Recent Labs    11/30/20 1632 11/30/20 2056 12/01/20 0605  GLUCAP 138* 148* 154*    Assessment/Plan: S/P Procedure(s) (LRB): XI ROBOTIC ASSISTED THORASCOPY, WEDGE RESECTION, MECHANICAL PLEURADESIS; APICAL PLEURECTOMY (Right) INTERCOSTAL NERVE BLOCK (Right)  Pulm- Chest tube in place, output is low, no air leak on suction... CXR with resolution of previous pneumthoraces... will leave chest tube to suction today, repeat CXR in AM Pain- shoulder blade discomfort is common with chest tube in place, nursing most used Morphine overnight, instructed to utilize Toradol as this works better for thoracic pain, continue tramadol prn Dispo- patient stable, leave chest tube on suction today, CXR free from pneumothorax,  will repeat CXR in AM, care per primary   LOS: 6 days   Lowella Dandy, PA-C' 12/01/2020  Agree with above Suction for 1 more day. Continue pulmonary hygiene.  Robertlee Rogacki Keane Scrape

## 2020-12-01 NOTE — Hospital Course (Signed)
      [address]       [name] [address]             [phone]    HPI:     Mr. Mey is a 75 yo man with a history of tobacco abuse, type II diabetes, HIV and PTSD. He presented to ED yesterday with acute onset of shortness of breath. He had choked on beer the night before and had a coughing spell. Felt short of breath which worsened overnight.   In ED found to have a large right pneumothorax. A pigtail catheter was placed and his breathing improved.  Hospital Course:   On 11/30/2020 Mr. Stickle underwent a Robotic assisted wedge resection of the upper and lower lobes, apical pleurectomy, mechanical pleurodesis, and intercostal nerve block with Dr. Cliffton Asters. He tolerated the procedure well and was transferred to the cardiac telemetry unit in stable condition. POD 1 we left his chest tube to suction and planned to repeat a chest xray in the morning. He was on Toradol for his rib pain and was having some expected shoulder blade pain from the chest tube. His labs remained stable.

## 2020-12-02 ENCOUNTER — Inpatient Hospital Stay (HOSPITAL_COMMUNITY): Payer: Medicare PPO

## 2020-12-02 LAB — GLUCOSE, CAPILLARY
Glucose-Capillary: 89 mg/dL (ref 70–99)
Glucose-Capillary: 93 mg/dL (ref 70–99)
Glucose-Capillary: 116 mg/dL — ABNORMAL HIGH (ref 70–99)
Glucose-Capillary: 93 mg/dL (ref 70–99)

## 2020-12-02 LAB — CBC
HCT: 30.5 % — ABNORMAL LOW (ref 39.0–52.0)
Hemoglobin: 10.5 g/dL — ABNORMAL LOW (ref 13.0–17.0)
MCH: 29.7 pg (ref 26.0–34.0)
MCHC: 34.4 g/dL (ref 30.0–36.0)
MCV: 86.4 fL (ref 80.0–100.0)
Platelets: 225 10*3/uL (ref 150–400)
RBC: 3.53 MIL/uL — ABNORMAL LOW (ref 4.22–5.81)
RDW: 14.2 % (ref 11.5–15.5)
WBC: 9.7 10*3/uL (ref 4.0–10.5)
nRBC: 0 % (ref 0.0–0.2)

## 2020-12-02 LAB — COMPREHENSIVE METABOLIC PANEL
ALT: 19 U/L (ref 0–44)
AST: 34 U/L (ref 15–41)
Albumin: 3 g/dL — ABNORMAL LOW (ref 3.5–5.0)
Alkaline Phosphatase: 44 U/L (ref 38–126)
Anion gap: 7 (ref 5–15)
BUN: 28 mg/dL — ABNORMAL HIGH (ref 8–23)
CO2: 25 mmol/L (ref 22–32)
Calcium: 8.6 mg/dL — ABNORMAL LOW (ref 8.9–10.3)
Chloride: 97 mmol/L — ABNORMAL LOW (ref 98–111)
Creatinine, Ser: 1.32 mg/dL — ABNORMAL HIGH (ref 0.61–1.24)
GFR, Estimated: 57 mL/min — ABNORMAL LOW (ref >60)
Glucose, Bld: 86 mg/dL (ref 70–99)
Potassium: 3.8 mmol/L (ref 3.5–5.1)
Sodium: 129 mmol/L — ABNORMAL LOW (ref 135–145)
Total Bilirubin: 0.7 mg/dL (ref 0.3–1.2)
Total Protein: 6.5 g/dL (ref 6.5–8.1)

## 2020-12-02 MED ORDER — SODIUM CHLORIDE 0.9 % IV SOLN: INTRAVENOUS | Status: DC

## 2020-12-02 MED ORDER — GUAIFENESIN ER 600 MG PO TB12
1200.0000 mg | ORAL_TABLET | Freq: Two times a day (BID) | ORAL | Status: DC
  Administered 2020-12-02 – 2020-12-05 (×7): 1200 mg via ORAL
  Filled 2020-12-02 (×7): qty 2

## 2020-12-02 MED ORDER — LAMIVUDINE 150 MG PO TABS
150.0000 mg | ORAL_TABLET | Freq: Every day | ORAL | Status: DC
  Administered 2020-12-03: 150 mg via ORAL
  Filled 2020-12-02: qty 1

## 2020-12-02 NOTE — Progress Notes (Signed)
Patient ambulated in hallway with rolling walker and nursing staff 940 feet. Patient back in room assisted to chair call bell with in reach. Shaheen Star, Randall An RN

## 2020-12-02 NOTE — Progress Notes (Signed)
PROGRESS NOTE    Chris Jennings  ZOX:[id number] DOB: 04/04/1946 DOA: 11/25/2020 PCP: [facility]    Brief Narrative:  75 year old with a history of DM, HIV, and PTSD who presented to the ED with the sudden onset of severe shortness of breath.  Evaluation revealed a right-sided tension pneumothorax.  A chest tube was placed in the ED.  Assessment & Plan:   Principal Problem:   Tension pneumothorax, spontaneous Active Problems:   HIV (human immunodeficiency virus infection) (HCC)   Diabetes (HCC)  Right spontaneous tension pneumothorax Chest tube placed in ED at time of presentation CT surgery following. Noted to have development of repeat near collapse of lung with tension component noted, s/p VATS 6/30 Chest tube remains in place Cont plan per CTS, possible place CT to water seal today per CTS   HIV Continue usual home medications   DM2 CBG trends reviewed Remains stable at this time   BPH Continue usual Proscar  Remains stable at this time  ARF -Clinically dehydrated -Will start basal IVF -Repeat bmet in AM  Hyponatremia -sodium gradually trending down to 129 today -Clinically dehydrated per above -On IVF per above -recheck bmet in AM  DVT prophylaxis: SCD's Code Status: Full Family Communication: Pt in room, family not at bedside  Status is: Inpatient  Remains inpatient appropriate because:Inpatient level of care appropriate due to severity of illness  Dispo: The patient is from: Home              Anticipated d/c is to: Home              Patient currently is not medically stable to d/c.   Difficult to place patient No   Consultants:  CT Surgery  Procedures:    Antimicrobials: Anti-infectives (From admission, onward)    Start     Dose/Rate Route Frequency Ordered Stop   12/03/20 1000  lamiVUDine (EPIVIR) tablet 150 mg        150 mg Oral Daily 12/02/20 1249     12/01/20 1445  lamiVUDine (EPIVIR) tablet 300 mg  Status:  Discontinued         300 mg Oral Daily 12/01/20 1359 12/02/20 1249   12/01/20 1200  darunavir-cobicistat (PREZCOBIX) 800-150 MG per tablet 1 tablet        1 tablet Oral Daily with lunch 11/30/20 1717     11/30/20 2200  vancomycin (VANCOREADY) IVPB 1000 mg/200 mL        1,000 mg 200 mL/hr over 60 Minutes Intravenous Every 12 hours 11/30/20 1717 11/30/20 2148   11/30/20 0941  vancomycin (VANCOCIN) 1-5 GM/200ML-% IVPB       Note to Pharmacy: Randa Ngohomas, Dominique   : cabinet override      11/30/20 0941 11/30/20 2144   11/30/20 0915  vancomycin (VANCOCIN) 1-5 GM/200ML-% IVPB       Note to Pharmacy: Randa Ngohomas, Dominique   : cabinet override      11/30/20 0915 11/30/20 0953   11/30/20 0830  vancomycin (VANCOREADY) IVPB 1000 mg/200 mL  Status:  Discontinued        1,000 mg 200 mL/hr over 60 Minutes Intravenous To Short Stay 11/30/20 0823 11/30/20 1712   11/26/20 1200  darunavir-cobicistat (PREZCOBIX) 800-150 MG per tablet 1 tablet  Status:  Discontinued        1 tablet Oral Daily with lunch 11/26/20 0856 11/30/20 1717   11/26/20 1000  Dolutegravir-lamiVUDine 50-300 MG TABS 1 tablet  Status:  Discontinued  1 tablet Oral Daily 11/26/20 0856 11/26/20 0907   11/26/20 1000  dolutegravir (TIVICAY) tablet 50 mg  Status:  Discontinued       See Hyperspace for full Linked Orders Report.   50 mg Oral Daily 11/26/20 0910 11/30/20 1717   11/26/20 1000  lamiVUDine (EPIVIR) tablet 300 mg  Status:  Discontinued       See Hyperspace for full Linked Orders Report.   300 mg Oral Daily 11/26/20 0910 11/30/20 1717       Subjective: Without complaints today  Objective: Vitals:   12/02/20 0552 12/02/20 0732 12/02/20 1207 12/02/20 1626  BP: 116/72 104/78 99/72 99/88   Pulse: 66 63 65 75  Resp: 17 18 19 16   Temp: 97.6 F (36.4 C) 97.6 F (36.4 C) (!) 97.4 F (36.3 C)   TempSrc: Oral Oral Oral   SpO2: 100% 98% 100% 100%  Weight:      Height:        Intake/Output Summary (Last 24 hours) at 12/02/2020 1807 Last data filed  at 12/02/2020 1600 Gross per 24 hour  Intake 811.83 ml  Output 470 ml  Net 341.83 ml    Filed Weights   11/25/20 2154  Weight: 58.1 kg    Examination: General exam: Conversant, in no acute distress Respiratory system: normal chest rise, clear, no audible wheezing Cardiovascular system: regular rhythm, s1-s2 Gastrointestinal system: Nondistended, nontender, pos BS Central nervous system: No seizures, no tremors Extremities: No cyanosis, no joint deformities Skin: No rashes, no pallor Psychiatry: Affect normal // no auditory hallucinations   Data Reviewed: I have personally reviewed following labs and imaging studies  CBC: Recent Labs  Lab 11/29/20 0302 11/30/20 0126 11/30/20 1529 11/30/20 1601 12/01/20 0128 12/02/20 0122  WBC 4.6 4.9  --   --  6.3 9.7  HGB 12.4* 12.6* 11.6* 11.6* 11.4* 10.5*  HCT 35.8* 35.8* 34.0* 34.0* 33.9* 30.5*  MCV 86.1 84.6  --   --  87.4 86.4  PLT 155 189  --   --  216 225    Basic Metabolic Panel: Recent Labs  Lab 11/29/20 0302 11/30/20 0126 11/30/20 0928 11/30/20 1529 11/30/20 1601 12/01/20 0128 12/02/20 0122  NA 132* 131* 133* 135 135 131* 129*  K 3.8 3.8 4.2 3.7 4.1 4.3 3.8  CL 100 99 103  --   --  98 97*  CO2 26 23 20*  --   --  26 25  GLUCOSE 151* 108* 126*  --   --  190* 86  BUN 14 12 11   --   --  17 28*  CREATININE 1.08 1.02 0.97  --   --  1.19 1.32*  CALCIUM 9.3 9.3 9.1  --   --  8.7* 8.6*    GFR: Estimated Creatinine Clearance: 40.3 mL/min (A) (by C-G formula based on SCr of 1.32 mg/dL (H)). Liver Function Tests: Recent Labs  Lab 11/30/20 0928 12/02/20 0122  AST 24 34  ALT 16 19  ALKPHOS 51 44  BILITOT 0.9 0.7  PROT 6.9 6.5  ALBUMIN 3.0* 3.0*    No results for input(s): LIPASE, AMYLASE in the last 168 hours. No results for input(s): AMMONIA in the last 168 hours. Coagulation Profile: Recent Labs  Lab 11/30/20 0928  INR 1.0    Cardiac Enzymes: No results for input(s): CKTOTAL, CKMB, CKMBINDEX, TROPONINI  in the last 168 hours. BNP (last 3 results) No results for input(s): PROBNP in the last 8760 hours. HbA1C: No results for input(s): HGBA1C in the  last 72 hours. CBG: Recent Labs  Lab 12/01/20 1637 12/01/20 2126 12/02/20 0631 12/02/20 1204 12/02/20 1702  GLUCAP 76 128* 89 93 116*    Lipid Profile: No results for input(s): CHOL, HDL, LDLCALC, TRIG, CHOLHDL, LDLDIRECT in the last 72 hours. Thyroid Function Tests: No results for input(s): TSH, T4TOTAL, FREET4, T3FREE, THYROIDAB in the last 72 hours. Anemia Panel: No results for input(s): VITAMINB12, FOLATE, FERRITIN, TIBC, IRON, RETICCTPCT in the last 72 hours. Sepsis Labs: No results for input(s): PROCALCITON, LATICACIDVEN in the last 168 hours.  Recent Results (from the past 240 hour(s))  SARS CORONAVIRUS 2 (TAT 6-24 HRS) Nasopharyngeal Nasopharyngeal Swab     Status: None   Collection Time: 11/25/20  7:06 PM   Specimen: Nasopharyngeal Swab  Result Value Ref Range Status   SARS Coronavirus 2 NEGATIVE NEGATIVE Final    Comment: (NOTE) SARS-CoV-2 target nucleic acids are NOT DETECTED.  The SARS-CoV-2 RNA is generally detectable in upper and lower respiratory specimens during the acute phase of infection. Negative results do not preclude SARS-CoV-2 infection, do not rule out co-infections with other pathogens, and should not be used as the sole basis for treatment or other patient management decisions. Negative results must be combined with clinical observations, patient history, and epidemiological information. The expected result is Negative.  Fact Sheet for Patients: HairSlick.no  Fact Sheet for Healthcare Providers: quierodirigir.com  This test is not yet approved or cleared by the [place] FDA and  has been authorized for detection and/or diagnosis of SARS-CoV-2 by FDA under an Emergency Use Authorization (EUA). This EUA will remain  in effect (meaning this  test can be used) for the duration of the COVID-19 declaration under Se ction 564(b)(1) of the Act, 21 U.S.C. section 360bbb-3(b)(1), unless the authorization is terminated or revoked sooner.  Performed at [facility], [address], [place], [place] [address]   Surgical pcr screen     Status: None   Collection Time: 11/30/20 11:04 AM   Specimen: Nasal Mucosa; Nasal Swab  Result Value Ref Range Status   MRSA, PCR NEGATIVE NEGATIVE Final   Staphylococcus aureus NEGATIVE NEGATIVE Final    Comment: (NOTE) The Xpert SA Assay (FDA approved for NASAL specimens in patients 17 years of age and older), is one component of a comprehensive surveillance program. It is not intended to diagnose infection nor to guide or monitor treatment. Performed at [facility], [address] [place], [place] [address]       Radiology Studies: DG CHEST PORT 1 VIEW  Result Date: 12/02/2020 CLINICAL DATA:  Right pneumothorax.  Evaluate chest tube. EXAM: PORTABLE CHEST 1 VIEW COMPARISON:  12/01/2020 FINDINGS: Right chest tube is in stable position near the right lung apex. No evidence for pneumothorax. Left lung remains clear. Heart and mediastinum are within normal limits. The trachea is midline. Old left rib fractures. Stable densities along the periphery of the right mid chest. IMPRESSION: 1. Stable position of the right chest tube without a pneumothorax. 2. No acute findings. Electronically Signed   By: Richarda Overlie M.D.   On: 12/02/2020 09:16   DG Chest Port 1 View  Result Date: 12/01/2020 CLINICAL DATA:  Chest tube placement EXAM: PORTABLE CHEST 1 VIEW COMPARISON:  11/30/2020 FINDINGS: Right large bore chest tube is unchanged with its tip at the right apex. Mild parenchymal scarring within the right mid lung zone secondary to wedge resection. Right basilar pulmonary infiltrate has resolved. No pneumothorax or pleural effusion. Left lung  is clear. Thoracic aorta is tortuous, but unchanged. Cardiac  size within normal limits. Pulmonary vascularity is normal. Multiple healed left rib fractures are identified. IMPRESSION: Right chest tube in place.  No pneumothorax. Resolved right basilar pulmonary infiltrate. Status post right lung wedge resection. Electronically Signed   By: Helyn Numbers MD   On: 12/01/2020 06:39    Scheduled Meds:  acetaminophen  1,000 mg Oral Q6H   Or   acetaminophen (TYLENOL) oral liquid 160 mg/5 mL  1,000 mg Oral Q6H   bisacodyl  10 mg Oral Daily   darunavir-cobicistat  1 tablet Oral Q lunch   enoxaparin (LOVENOX) injection  40 mg Subcutaneous Daily   guaiFENesin  1,200 mg Oral BID   insulin aspart  0-24 Units Subcutaneous TID AC & HS   [START ON 12/03/2020] lamiVUDine  150 mg Oral Daily   senna-docusate  1 tablet Oral QHS   Continuous Infusions:  sodium chloride 75 mL/hr at 12/02/20 1544      LOS: 7 days   Rickey Barbara, MD [facility] Pager On Amion  If 7PM-7AM, please contact night-coverage 12/02/2020, 6:07 PM

## 2020-12-02 NOTE — Progress Notes (Addendum)
      [address]       [name] [address]             [phone]      2 Days Post-Op Procedure(s) (LRB): XI ROBOTIC ASSISTED THORASCOPY, WEDGE RESECTION, MECHANICAL PLEURADESIS; APICAL PLEURECTOMY (Right) INTERCOSTAL NERVE BLOCK (Right)  Subjective:  Doing okay.  Still notices shoulder discomfort.  Also notes he is struggling to cough up phlegm  Objective: Vital signs in last 24 hours: Temp:  [97.2 F (36.2 C)-98.4 F (36.9 C)] 97.6 F (36.4 C) (07/02 0732) Pulse Rate:  [62-76] 63 (07/02 0732) Cardiac Rhythm: Normal sinus rhythm (07/02 0700) Resp:  [10-20] 18 (07/02 0732) BP: (104-120)/(55-84) 104/78 (07/02 0732) SpO2:  [91 %-100 %] 98 % (07/02 0732)  Intake/Output from previous day: 07/01 0701 - 07/02 0700 In: 480 [P.O.:480] Out: 560 [Urine:480; Chest Tube:80]   General appearance: alert, cooperative, and no distress Heart: regular rate and rhythm Lungs: clear to auscultation bilaterally Wound: clean and dry  Lab Results: Recent Labs    12/01/20 0128 12/02/20 0122  WBC 6.3 9.7  HGB 11.4* 10.5*  HCT 33.9* 30.5*  PLT 216 225   BMET:  Recent Labs    12/01/20 0128 12/02/20 0122  NA 131* 129*  K 4.3 3.8  CL 98 97*  CO2 26 25  GLUCOSE 190* 86  BUN 17 28*  CREATININE 1.19 1.32*  CALCIUM 8.7* 8.6*    PT/INR:  Recent Labs    11/30/20 0928  LABPROT 13.2  INR 1.0   ABG    Component Value Date/Time   PHART 7.388 12/01/2020 0430   HCO3 23.6 12/01/2020 0430   TCO2 26 11/30/2020 1601   ACIDBASEDEF 0.7 12/01/2020 0430   O2SAT 95.3 12/01/2020 0430   CBG (last 3)  Recent Labs    12/01/20 1637 12/01/20 2126 12/02/20 0631  GLUCAP 76 128* 89    Assessment/Plan: S/P Procedure(s) (LRB): XI ROBOTIC ASSISTED THORASCOPY, WEDGE RESECTION, MECHANICAL PLEURADESIS; APICAL PLEURECTOMY (Right) INTERCOSTAL NERVE BLOCK (Right)  Chest tube remains on suction, no air leak present, output is 80 cc.. CXR is free from pneumothorax- will  discuss possible transition to water seal today Cough- will add Mucinex to help with sputum removal Dispo- patient stable, care per primary, possibly place chest tube to water seal today   LOS: 7 days   Chris Dandy, PA-C 12/02/2020  Agree with above Chest tube to waterseal today. Aggressive ambulation.  Chris Jennings

## 2020-12-03 ENCOUNTER — Inpatient Hospital Stay (HOSPITAL_COMMUNITY): Payer: Medicare PPO

## 2020-12-03 DIAGNOSIS — Z4682 Encounter for fitting and adjustment of non-vascular catheter: Secondary | ICD-10-CM

## 2020-12-03 LAB — GLUCOSE, CAPILLARY
Glucose-Capillary: 94 mg/dL (ref 70–99)
Glucose-Capillary: 85 mg/dL (ref 70–99)
Glucose-Capillary: 95 mg/dL (ref 70–99)
Glucose-Capillary: 165 mg/dL — ABNORMAL HIGH (ref 70–99)

## 2020-12-03 LAB — COMPREHENSIVE METABOLIC PANEL
ALT: 19 U/L (ref 0–44)
AST: 29 U/L (ref 15–41)
Albumin: 2.8 g/dL — ABNORMAL LOW (ref 3.5–5.0)
Alkaline Phosphatase: 37 U/L — ABNORMAL LOW (ref 38–126)
Anion gap: 7 (ref 5–15)
BUN: 19 mg/dL (ref 8–23)
CO2: 23 mmol/L (ref 22–32)
Calcium: 8.5 mg/dL — ABNORMAL LOW (ref 8.9–10.3)
Chloride: 102 mmol/L (ref 98–111)
Creatinine, Ser: 1.04 mg/dL (ref 0.61–1.24)
GFR, Estimated: >60 mL/min (ref >60)
Glucose, Bld: 76 mg/dL (ref 70–99)
Potassium: 4.3 mmol/L (ref 3.5–5.1)
Sodium: 132 mmol/L — ABNORMAL LOW (ref 135–145)
Total Bilirubin: 0.7 mg/dL (ref 0.3–1.2)
Total Protein: 6.2 g/dL — ABNORMAL LOW (ref 6.5–8.1)

## 2020-12-03 MED ORDER — LAMIVUDINE 150 MG PO TABS
300.0000 mg | ORAL_TABLET | Freq: Every day | ORAL | Status: DC
  Administered 2020-12-04: 300 mg via ORAL
  Filled 2020-12-03: qty 2

## 2020-12-03 NOTE — Significant Event (Signed)
Rapid Response Event Note   Reason for Call :  Right sided shoulder pain  Initial Focused Assessment:  Patient developed sudden sharp and severe pain in his right scapular area. As per the NT, he did not have this extreme of pain when they walked earlier. Patient was doubled over in pain and clenching his right shoulder. The patient was yelling, gagging, and dry heaving into bath basin during this pain event. This pain is on the same side as his chest tube for his tension pneumothorax. Chest tube appears to be in place. Appropriate tidaling in chambers during inhalation and expiration. No sub q air felt on exam. Bilateral breath sounds auscultated  BP 152/87 EKG 72 RR 23 O2 100   Interventions:  Rn administered morphine and zofran. After administration, the patient seemed to calm down.  12-lead EKG obtained and it showed NSR with occasional PVCs  Plan of Care:  Patient will remain on the unit and Rns will continue to monitor the patient.   Event Summary:   MD Notified: Hospitalist Call Time: 1800 Arrival Time: 1830 End Time:1905  Andrey Spearman, RN

## 2020-12-03 NOTE — Progress Notes (Addendum)
      [address]       [place] [address]             [phone]      3 Days Post-Op Procedure(s) (LRB): XI ROBOTIC ASSISTED THORASCOPY, WEDGE RESECTION, MECHANICAL PLEURADESIS; APICAL PLEURECTOMY (Right) INTERCOSTAL NERVE BLOCK (Right)  Subjective:  Patient sitting up in chair.  Feels better after getting cleaned up.  He is requesting an xray on his left thumb to see if the break is healed.  Objective: Vital signs in last 24 hours: Temp:  [97.4 F (36.3 C)-98 F (36.7 C)] 97.5 F (36.4 C) (07/03 0839) Pulse Rate:  [60-81] 81 (07/03 0842) Cardiac Rhythm: Normal sinus rhythm (07/03 0700) Resp:  [15-20] 20 (07/03 0330) BP: (99-137)/(62-91) 123/62 (07/03 0842) SpO2:  [98 %-100 %] 98 % (07/03 0842)  Intake/Output from previous day: 07/02 0701 - 07/03 0700 In: 1454.2 [P.O.:120; I.V.:1334.2] Out: 1030 [Urine:1000; Chest Tube:30]  General appearance: alert, cooperative, and no distress Heart: regular rate and rhythm Lungs: clear to auscultation bilaterally Wound: clean and dry  Lab Results: Recent Labs    12/01/20 0128 12/02/20 0122  WBC 6.3 9.7  HGB 11.4* 10.5*  HCT 33.9* 30.5*  PLT 216 225   BMET:  Recent Labs    12/02/20 0122 12/03/20 0118  NA 129* 132*  K 3.8 4.3  CL 97* 102  CO2 25 23  GLUCOSE 86 76  BUN 28* 19  CREATININE 1.32* 1.04  CALCIUM 8.6* 8.5*    PT/INR: No results for input(s): LABPROT, INR in the last 72 hours. ABG    Component Value Date/Time   PHART 7.388 12/01/2020 0430   HCO3 23.6 12/01/2020 0430   TCO2 26 11/30/2020 1601   ACIDBASEDEF 0.7 12/01/2020 0430   O2SAT 95.3 12/01/2020 0430   CBG (last 3)  Recent Labs    12/02/20 1702 12/02/20 2122 12/03/20 0627  GLUCAP 116* 93 94    Assessment/Plan: S/P Procedure(s) (LRB): XI ROBOTIC ASSISTED THORASCOPY, WEDGE RESECTION, MECHANICAL PLEURADESIS; APICAL PLEURECTOMY (Right) INTERCOSTAL NERVE BLOCK (Right)  Chest tube w/o air leak, CXR with trace apical  pneumothorax- will discuss removal vs. Clamp with repeat CXR and removal if pneumothorax remains stable with Dr. Cliffton Asters Cough- improved with Mucinex Left thumb fracture- will defer to primary on obtaining repeat xray Dispo- patient stable, continue current care, will discuss CT management with Dr. Cliffton Asters   LOS: 8 days    Lowella Dandy, PA-C 12/03/2020   Agree with above Small pneumoT Will keep tube one more day.  Aggressive pulm hygiene  Teniqua Marron O Kearston Putman

## 2020-12-03 NOTE — Progress Notes (Signed)
Dressing to right chest tube site saturated. Dressing changed. Cortlynn Hollinsworth, Randall An RN

## 2020-12-03 NOTE — Progress Notes (Signed)
Patient with another episode of sudden sharp right shoulder and arm/axilla pain. Chest tube in place dressing intact. Patient also with some nausea. Pain medication and nausea medication given as ordered as needed see MAR. Patient with Bilat breath sounds, no sub Q air noted at this time. Dr. Rhona Leavens made aware through [facility] system. Report given to on coming RN. At this time pain is easing off per patient. Jeananne Bedwell, Randall An RN

## 2020-12-03 NOTE — Progress Notes (Addendum)
Patient with sudden severe sharp pain in right shoulder blade moving through out right arm., at toradol given as ordered as needed for pain. And pain is easing off at this time per patient report. Call bell within reach. Bryley Chrisman, Randall An RN   [id number] Dr. Cliffton Asters made aware no new orders received. At this time. Cionna Collantes, Randall An RN

## 2020-12-03 NOTE — Progress Notes (Signed)
PROGRESS NOTE    Chris Jennings  NOT:[id number] DOB: 23-Nov-1945 DOA: 11/25/2020 PCP: [facility]    Brief Narrative:  75 year old with a history of DM, HIV, and PTSD who presented to the ED with the sudden onset of severe shortness of breath.  Evaluation revealed a right-sided tension pneumothorax.  A chest tube was placed in the ED.  Assessment & Plan:   Principal Problem:   Tension pneumothorax, spontaneous Active Problems:   HIV (human immunodeficiency virus infection) (HCC)   Diabetes (HCC)  Right spontaneous tension pneumothorax Chest tube placed in ED at time of presentation CT surgery following. Noted to have development of repeat near collapse of lung with tension component noted, s/p VATS 6/30 Per CT surgery, keep CT in one more day   HIV Continue usual home medications   DM2 CBG trends reviewed Remains stable at this time   BPH Continue usual Proscar  Remains stable at this time  ARF -improved with IVF  Hyponatremia -improved with IVF hydration  DVT prophylaxis: SCD's Code Status: Full Family Communication: Pt in room, family not at bedside  Status is: Inpatient  Remains inpatient appropriate because:Inpatient level of care appropriate due to severity of illness  Dispo: The patient is from: Home              Anticipated d/c is to: Home              Patient currently is not medically stable to d/c.   Difficult to place patient No   Consultants:  CT Surgery  Procedures:    Antimicrobials: Anti-infectives (From admission, onward)    Start     Dose/Rate Route Frequency Ordered Stop   12/04/20 1000  lamiVUDine (EPIVIR) tablet 300 mg        300 mg Oral Daily 12/03/20 1228     12/03/20 1000  lamiVUDine (EPIVIR) tablet 150 mg  Status:  Discontinued        150 mg Oral Daily 12/02/20 1249 12/03/20 1228   12/01/20 1445  lamiVUDine (EPIVIR) tablet 300 mg  Status:  Discontinued        300 mg Oral Daily 12/01/20 1359 12/02/20 1249   12/01/20  1200  darunavir-cobicistat (PREZCOBIX) 800-150 MG per tablet 1 tablet        1 tablet Oral Daily with lunch 11/30/20 1717     11/30/20 2200  vancomycin (VANCOREADY) IVPB 1000 mg/200 mL        1,000 mg 200 mL/hr over 60 Minutes Intravenous Every 12 hours 11/30/20 1717 11/30/20 2148   11/30/20 0941  vancomycin (VANCOCIN) 1-5 GM/200ML-% IVPB       Note to Pharmacy: Randa Ngo   : cabinet override      11/30/20 0941 11/30/20 2144   11/30/20 0915  vancomycin (VANCOCIN) 1-5 GM/200ML-% IVPB       Note to Pharmacy: Randa Ngo   : cabinet override      11/30/20 0915 11/30/20 0953   11/30/20 0830  vancomycin (VANCOREADY) IVPB 1000 mg/200 mL  Status:  Discontinued        1,000 mg 200 mL/hr over 60 Minutes Intravenous To Short Stay 11/30/20 0823 11/30/20 1712   11/26/20 1200  darunavir-cobicistat (PREZCOBIX) 800-150 MG per tablet 1 tablet  Status:  Discontinued        1 tablet Oral Daily with lunch 11/26/20 0856 11/30/20 1717   11/26/20 1000  Dolutegravir-lamiVUDine 50-300 MG TABS 1 tablet  Status:  Discontinued        1  tablet Oral Daily 11/26/20 0856 11/26/20 0907   11/26/20 1000  dolutegravir (TIVICAY) tablet 50 mg  Status:  Discontinued       See Hyperspace for full Linked Orders Report.   50 mg Oral Daily 11/26/20 0910 11/30/20 1717   11/26/20 1000  lamiVUDine (EPIVIR) tablet 300 mg  Status:  Discontinued       See Hyperspace for full Linked Orders Report.   300 mg Oral Daily 11/26/20 0910 11/30/20 1717       Subjective: Without complaints  Objective: Vitals:   12/03/20 0330 12/03/20 0839 12/03/20 0842 12/03/20 1102  BP: 120/73  123/62 (!) 142/82  Pulse: 65  81 74  Resp: 20   18  Temp: 97.8 F (36.6 C) (!) 97.5 F (36.4 C)  (!) 97.5 F (36.4 C)  TempSrc: Oral   Oral  SpO2: 100%  98% 100%  Weight:      Height:        Intake/Output Summary (Last 24 hours) at 12/03/2020 1520 Last data filed at 12/03/2020 4193 Gross per 24 hour  Intake 1534.21 ml  Output 1130 ml   Net 404.21 ml    Filed Weights   11/25/20 2154  Weight: 58.1 kg    Examination: General exam: Awake, laying in bed, in nad Respiratory system: Normal respiratory effort, no wheezing, CT in place Cardiovascular system: regular rate, s1, s2 Gastrointestinal system: Soft, nondistended, positive BS Central nervous system: CN2-12 grossly intact, strength intact Extremities: Perfused, no clubbing Skin: Normal skin turgor, no notable skin lesions seen Psychiatry: Mood normal // no visual hallucinations   Data Reviewed: I have personally reviewed following labs and imaging studies  CBC: Recent Labs  Lab 11/29/20 0302 11/30/20 0126 11/30/20 1529 11/30/20 1601 12/01/20 0128 12/02/20 0122  WBC 4.6 4.9  --   --  6.3 9.7  HGB 12.4* 12.6* 11.6* 11.6* 11.4* 10.5*  HCT 35.8* 35.8* 34.0* 34.0* 33.9* 30.5*  MCV 86.1 84.6  --   --  87.4 86.4  PLT 155 189  --   --  216 225    Basic Metabolic Panel: Recent Labs  Lab 11/30/20 0126 11/30/20 0928 11/30/20 1529 11/30/20 1601 12/01/20 0128 12/02/20 0122 12/03/20 0118  NA 131* 133* 135 135 131* 129* 132*  K 3.8 4.2 3.7 4.1 4.3 3.8 4.3  CL 99 103  --   --  98 97* 102  CO2 23 20*  --   --  26 25 23   GLUCOSE 108* 126*  --   --  190* 86 76  BUN 12 11  --   --  17 28* 19  CREATININE 1.02 0.97  --   --  1.19 1.32* 1.04  CALCIUM 9.3 9.1  --   --  8.7* 8.6* 8.5*    GFR: Estimated Creatinine Clearance: 51.2 mL/min (by C-G formula based on SCr of 1.04 mg/dL). Liver Function Tests: Recent Labs  Lab 11/30/20 0928 12/02/20 0122 12/03/20 0118  AST 24 34 29  ALT 16 19 19   ALKPHOS 51 44 37*  BILITOT 0.9 0.7 0.7  PROT 6.9 6.5 6.2*  ALBUMIN 3.0* 3.0* 2.8*    No results for input(s): LIPASE, AMYLASE in the last 168 hours. No results for input(s): AMMONIA in the last 168 hours. Coagulation Profile: Recent Labs  Lab 11/30/20 0928  INR 1.0    Cardiac Enzymes: No results for input(s): CKTOTAL, CKMB, CKMBINDEX, TROPONINI in the last  168 hours. BNP (last 3 results) No results for input(s): PROBNP in  the last 8760 hours. HbA1C: No results for input(s): HGBA1C in the last 72 hours. CBG: Recent Labs  Lab 12/02/20 1204 12/02/20 1702 12/02/20 2122 12/03/20 0627 12/03/20 1103  GLUCAP 93 116* 93 94 85    Lipid Profile: No results for input(s): CHOL, HDL, LDLCALC, TRIG, CHOLHDL, LDLDIRECT in the last 72 hours. Thyroid Function Tests: No results for input(s): TSH, T4TOTAL, FREET4, T3FREE, THYROIDAB in the last 72 hours. Anemia Panel: No results for input(s): VITAMINB12, FOLATE, FERRITIN, TIBC, IRON, RETICCTPCT in the last 72 hours. Sepsis Labs: No results for input(s): PROCALCITON, LATICACIDVEN in the last 168 hours.  Recent Results (from the past 240 hour(s))  SARS CORONAVIRUS 2 (TAT 6-24 HRS) Nasopharyngeal Nasopharyngeal Swab     Status: None   Collection Time: 11/25/20  7:06 PM   Specimen: Nasopharyngeal Swab  Result Value Ref Range Status   SARS Coronavirus 2 NEGATIVE NEGATIVE Final    Comment: (NOTE) SARS-CoV-2 target nucleic acids are NOT DETECTED.  The SARS-CoV-2 RNA is generally detectable in upper and lower respiratory specimens during the acute phase of infection. Negative results do not preclude SARS-CoV-2 infection, do not rule out co-infections with other pathogens, and should not be used as the sole basis for treatment or other patient management decisions. Negative results must be combined with clinical observations, patient history, and epidemiological information. The expected result is Negative.  Fact Sheet for Patients: HairSlick.nohttps://www.fda.gov/media/138098/download  Fact Sheet for Healthcare Providers: quierodirigir.comhttps://www.fda.gov/media/138095/download  This test is not yet approved or cleared by the [place] FDA and  has been authorized for detection and/or diagnosis of SARS-CoV-2 by FDA under an Emergency Use Authorization (EUA). This EUA will remain  in effect (meaning this test can be  used) for the duration of the COVID-19 declaration under Se ction 564(b)(1) of the Act, 21 U.S.C. section 360bbb-3(b)(1), unless the authorization is terminated or revoked sooner.  Performed at [facility], [address] [place], [place] [address]   Surgical pcr screen     Status: None   Collection Time: 11/30/20 11:04 AM   Specimen: Nasal Mucosa; Nasal Swab  Result Value Ref Range Status   MRSA, PCR NEGATIVE NEGATIVE Final   Staphylococcus aureus NEGATIVE NEGATIVE Final    Comment: (NOTE) The Xpert SA Assay (FDA approved for NASAL specimens in patients 75 years of age and older), is one component of a comprehensive surveillance program. It is not intended to diagnose infection nor to guide or monitor treatment. Performed at [facility], [address] [place], [place] [address]       Radiology Studies: DG CHEST PORT 1 VIEW  Result Date: 12/03/2020 CLINICAL DATA:  Shortness of breath, chest pain. EXAM: PORTABLE CHEST 1 VIEW COMPARISON:  December 02, 2020. FINDINGS: The heart size and mediastinal contours are within normal limits. Stable right-sided chest tube. Minimal right apical pneumothorax is noted. Lungs are clear. Old bilateral rib fractures are noted. IMPRESSION: Stable right-sided chest tube.  Minimal right apical pneumothorax. Electronically Signed   By: Lupita RaiderJames  Green Jr M.D.   On: 12/03/2020 08:57   DG CHEST PORT 1 VIEW  Result Date: 12/02/2020 CLINICAL DATA:  Right pneumothorax.  Evaluate chest tube. EXAM: PORTABLE CHEST 1 VIEW COMPARISON:  12/01/2020 FINDINGS: Right chest tube is in stable position near the right lung apex. No evidence for pneumothorax. Left lung remains clear. Heart and mediastinum are within normal limits. The trachea is midline. Old left rib fractures. Stable densities along the periphery of the right mid chest.  IMPRESSION: 1. Stable position of the right chest tube without a pneumothorax. 2. No acute findings. Electronically Signed   By: Richarda Overlie M.D.   On: 12/02/2020 09:16    Scheduled Meds:  acetaminophen  1,000 mg Oral Q6H   Or   acetaminophen (TYLENOL) oral liquid 160 mg/5 mL  1,000 mg Oral Q6H   bisacodyl  10 mg Oral Daily   darunavir-cobicistat  1 tablet Oral Q lunch   enoxaparin (LOVENOX) injection  40 mg Subcutaneous Daily   guaiFENesin  1,200 mg Oral BID   insulin aspart  0-24 Units Subcutaneous TID AC & HS   [START ON 12/04/2020] lamiVUDine  300 mg Oral Daily   senna-docusate  1 tablet Oral QHS   Continuous Infusions:     LOS: 8 days   Rickey Barbara, MD [facility] Pager On Amion  If 7PM-7AM, please contact night-coverage 12/03/2020, 3:20 PM

## 2020-12-04 ENCOUNTER — Inpatient Hospital Stay (HOSPITAL_COMMUNITY): Payer: Medicare PPO

## 2020-12-04 LAB — GLUCOSE, CAPILLARY
Glucose-Capillary: 95 mg/dL (ref 70–99)
Glucose-Capillary: 92 mg/dL (ref 70–99)
Glucose-Capillary: 92 mg/dL (ref 70–99)
Glucose-Capillary: 134 mg/dL — ABNORMAL HIGH (ref 70–99)
Glucose-Capillary: 78 mg/dL (ref 70–99)

## 2020-12-04 MED ORDER — DOLUTEGRAVIR SODIUM 50 MG PO TABS
50.0000 mg | ORAL_TABLET | Freq: Once | ORAL | Status: AC
  Administered 2020-12-04: 50 mg via ORAL
  Filled 2020-12-04: qty 1

## 2020-12-04 MED ORDER — LAMIVUDINE 150 MG PO TABS
300.0000 mg | ORAL_TABLET | Freq: Every day | ORAL | Status: DC
  Administered 2020-12-05: 300 mg via ORAL
  Filled 2020-12-04: qty 2

## 2020-12-04 MED ORDER — DOLUTEGRAVIR SODIUM 50 MG PO TABS
50.0000 mg | ORAL_TABLET | Freq: Every day | ORAL | Status: DC
  Administered 2020-12-05: 50 mg via ORAL
  Filled 2020-12-04: qty 1

## 2020-12-04 NOTE — Progress Notes (Signed)
Patient ambulated in hallway with nursing staff. 450 feet gait slightly unsteady. Back in chair call bell with in reach. Courtez Twaddle, Randall An RN

## 2020-12-04 NOTE — Progress Notes (Signed)
Dr Margo Aye paged to notify of patients pain and measure taken for patient , no new orders given

## 2020-12-04 NOTE — Progress Notes (Signed)
Chest tube removal as ordered. Patient tolerated well bp 132/83 dressing applied. Timira Bieda, Randall An RN

## 2020-12-04 NOTE — Progress Notes (Signed)
Pt's HIV regimen is Prezcobix/Dovato. D/w Dr. Rhona Leavens and we will addon dolutegravir so the regimen would be complete.   Ulyses Southward, PharmD, BCIDP, AAHIVP, CPP Infectious Disease Pharmacist 12/04/2020 1:26 PM

## 2020-12-04 NOTE — Progress Notes (Signed)
PROGRESS NOTE    Chris Jennings  ZYS:[id number] DOB: 1945-11-02 DOA: 11/25/2020 PCP: [facility]    Brief Narrative:  75 year old with a history of DM, HIV, and PTSD who presented to the ED with the sudden onset of severe shortness of breath.  Evaluation revealed a right-sided tension pneumothorax.  A chest tube was placed in the ED.  Assessment & Plan:   Principal Problem:   Tension pneumothorax, spontaneous Active Problems:   HIV (human immunodeficiency virus infection) (HCC)   Diabetes (HCC)  Right spontaneous tension pneumothorax Chest tube placed in ED at time of presentation CT surgery following. Noted to have development of repeat near collapse of lung with tension component noted, s/p VATS 6/30 Per CT surgery, possibility of removing chest tube today   HIV Continue usual home medications   DM2 CBG trends reviewed Remains stable at this time   BPH Continue usual Proscar  Remains stable at this time  ARF -noted to be improved with IVF  Hyponatremia -improved with IVF hydration  DVT prophylaxis: SCD's Code Status: Full Family Communication: Pt in room, family not at bedside  Status is: Inpatient  Remains inpatient appropriate because:Inpatient level of care appropriate due to severity of illness  Dispo: The patient is from: Home              Anticipated d/c is to: Home              Patient currently is not medically stable to d/c.   Difficult to place patient No   Consultants:  CT Surgery  Procedures:    Antimicrobials: Anti-infectives (From admission, onward)    Start     Dose/Rate Route Frequency Ordered Stop   12/05/20 1000  lamiVUDine (EPIVIR) tablet 300 mg       See Hyperspace for full Linked Orders Report.   300 mg Oral Daily 12/04/20 1323     12/05/20 1000  dolutegravir (TIVICAY) tablet 50 mg       See Hyperspace for full Linked Orders Report.   50 mg Oral Daily 12/04/20 1323     12/04/20 1415  dolutegravir (TIVICAY) tablet  50 mg        50 mg Oral  Once 12/04/20 1323     12/04/20 1000  lamiVUDine (EPIVIR) tablet 300 mg  Status:  Discontinued        300 mg Oral Daily 12/03/20 1228 12/04/20 1323   12/03/20 1000  lamiVUDine (EPIVIR) tablet 150 mg  Status:  Discontinued        150 mg Oral Daily 12/02/20 1249 12/03/20 1228   12/01/20 1445  lamiVUDine (EPIVIR) tablet 300 mg  Status:  Discontinued        300 mg Oral Daily 12/01/20 1359 12/02/20 1249   12/01/20 1200  darunavir-cobicistat (PREZCOBIX) 800-150 MG per tablet 1 tablet        1 tablet Oral Daily with lunch 11/30/20 1717     11/30/20 2200  vancomycin (VANCOREADY) IVPB 1000 mg/200 mL        1,000 mg 200 mL/hr over 60 Minutes Intravenous Every 12 hours 11/30/20 1717 11/30/20 2148   11/30/20 0941  vancomycin (VANCOCIN) 1-5 GM/200ML-% IVPB       Note to Pharmacy: Randa Ngo   : cabinet override      11/30/20 0941 11/30/20 2144   11/30/20 0915  vancomycin (VANCOCIN) 1-5 GM/200ML-% IVPB       Note to Pharmacy: Randa Ngo   : cabinet override  11/30/20 0915 11/30/20 0953   11/30/20 0830  vancomycin (VANCOREADY) IVPB 1000 mg/200 mL  Status:  Discontinued        1,000 mg 200 mL/hr over 60 Minutes Intravenous To Short Stay 11/30/20 0823 11/30/20 1712   11/26/20 1200  darunavir-cobicistat (PREZCOBIX) 800-150 MG per tablet 1 tablet  Status:  Discontinued        1 tablet Oral Daily with lunch 11/26/20 0856 11/30/20 1717   11/26/20 1000  Dolutegravir-lamiVUDine 50-300 MG TABS 1 tablet  Status:  Discontinued        1 tablet Oral Daily 11/26/20 0856 11/26/20 0907   11/26/20 1000  dolutegravir (TIVICAY) tablet 50 mg  Status:  Discontinued       See Hyperspace for full Linked Orders Report.   50 mg Oral Daily 11/26/20 0910 11/30/20 1717   11/26/20 1000  lamiVUDine (EPIVIR) tablet 300 mg  Status:  Discontinued       See Hyperspace for full Linked Orders Report.   300 mg Oral Daily 11/26/20 0910 11/30/20 1717       Subjective: No complaints this  AM  Objective: Vitals:   12/04/20 0400 12/04/20 0732 12/04/20 1129 12/04/20 1215  BP: 125/76 (!) 146/97 (!) 146/89 132/83  Pulse: 71 83 76 67  Resp: 20 19    Temp: 98.8 F (37.1 C) 97.7 F (36.5 C) 97.9 F (36.6 C)   TempSrc: Oral Oral Oral   SpO2: 100% 100% 100% 100%  Weight:      Height:        Intake/Output Summary (Last 24 hours) at 12/04/2020 1425 Last data filed at 12/04/2020 1200 Gross per 24 hour  Intake 120 ml  Output 1350 ml  Net -1230 ml    Filed Weights   11/25/20 2154  Weight: 58.1 kg    Examination: General exam: Conversant, in no acute distress Respiratory system: normal chest rise, clear, no audible wheezing Cardiovascular system: regular rhythm, s1-s2 Gastrointestinal system: Nondistended, nontender, pos BS Central nervous system: No seizures, no tremors Extremities: No cyanosis, no joint deformities Skin: No rashes, no pallor Psychiatry: Affect normal // no auditory hallucinations    Data Reviewed: I have personally reviewed following labs and imaging studies  CBC: Recent Labs  Lab 11/29/20 0302 11/30/20 0126 11/30/20 1529 11/30/20 1601 12/01/20 0128 12/02/20 0122  WBC 4.6 4.9  --   --  6.3 9.7  HGB 12.4* 12.6* 11.6* 11.6* 11.4* 10.5*  HCT 35.8* 35.8* 34.0* 34.0* 33.9* 30.5*  MCV 86.1 84.6  --   --  87.4 86.4  PLT 155 189  --   --  216 225    Basic Metabolic Panel: Recent Labs  Lab 11/30/20 0126 11/30/20 0928 11/30/20 1529 11/30/20 1601 12/01/20 0128 12/02/20 0122 12/03/20 0118  NA 131* 133* 135 135 131* 129* 132*  K 3.8 4.2 3.7 4.1 4.3 3.8 4.3  CL 99 103  --   --  98 97* 102  CO2 23 20*  --   --  26 25 23   GLUCOSE 108* 126*  --   --  190* 86 76  BUN 12 11  --   --  17 28* 19  CREATININE 1.02 0.97  --   --  1.19 1.32* 1.04  CALCIUM 9.3 9.1  --   --  8.7* 8.6* 8.5*    GFR: Estimated Creatinine Clearance: 51.2 mL/min (by C-G formula based on SCr of 1.04 mg/dL). Liver Function Tests: Recent Labs  Lab 11/30/20 [id number]  12/02/20 0122 12/03/20  0118  AST 24 34 29  ALT 16 19 19   ALKPHOS 51 44 37*  BILITOT 0.9 0.7 0.7  PROT 6.9 6.5 6.2*  ALBUMIN 3.0* 3.0* 2.8*    No results for input(s): LIPASE, AMYLASE in the last 168 hours. No results for input(s): AMMONIA in the last 168 hours. Coagulation Profile: Recent Labs  Lab 11/30/20 0928  INR 1.0    Cardiac Enzymes: No results for input(s): CKTOTAL, CKMB, CKMBINDEX, TROPONINI in the last 168 hours. BNP (last 3 results) No results for input(s): PROBNP in the last 8760 hours. HbA1C: No results for input(s): HGBA1C in the last 72 hours. CBG: Recent Labs  Lab 12/03/20 1600 12/03/20 2124 12/04/20 0636 12/04/20 1139 12/04/20 1141  GLUCAP 95 165* 95 92 92    Lipid Profile: No results for input(s): CHOL, HDL, LDLCALC, TRIG, CHOLHDL, LDLDIRECT in the last 72 hours. Thyroid Function Tests: No results for input(s): TSH, T4TOTAL, FREET4, T3FREE, THYROIDAB in the last 72 hours. Anemia Panel: No results for input(s): VITAMINB12, FOLATE, FERRITIN, TIBC, IRON, RETICCTPCT in the last 72 hours. Sepsis Labs: No results for input(s): PROCALCITON, LATICACIDVEN in the last 168 hours.  Recent Results (from the past 240 hour(s))  SARS CORONAVIRUS 2 (TAT 6-24 HRS) Nasopharyngeal Nasopharyngeal Swab     Status: None   Collection Time: 11/25/20  7:06 PM   Specimen: Nasopharyngeal Swab  Result Value Ref Range Status   SARS Coronavirus 2 NEGATIVE NEGATIVE Final    Comment: (NOTE) SARS-CoV-2 target nucleic acids are NOT DETECTED.  The SARS-CoV-2 RNA is generally detectable in upper and lower respiratory specimens during the acute phase of infection. Negative results do not preclude SARS-CoV-2 infection, do not rule out co-infections with other pathogens, and should not be used as the sole basis for treatment or other patient management decisions. Negative results must be combined with clinical observations, patient history, and epidemiological information. The  expected result is Negative.  Fact Sheet for Patients: 11/27/20  Fact Sheet for Healthcare Providers: HairSlick.no  This test is not yet approved or cleared by the [place] FDA and  has been authorized for detection and/or diagnosis of SARS-CoV-2 by FDA under an Emergency Use Authorization (EUA). This EUA will remain  in effect (meaning this test can be used) for the duration of the COVID-19 declaration under Se ction 564(b)(1) of the Act, 21 U.S.C. section 360bbb-3(b)(1), unless the authorization is terminated or revoked sooner.  Performed at [facility], [address] [place], [place]   Surgical pcr screen     Status: None   Collection Time: 11/30/20 11:04 AM   Specimen: Nasal Mucosa; Nasal Swab  Result Value Ref Range Status   MRSA, PCR NEGATIVE NEGATIVE Final   Staphylococcus aureus NEGATIVE NEGATIVE Final    Comment: (NOTE) The Xpert SA Assay (FDA approved for NASAL specimens in patients 60 years of age and older), is one component of a comprehensive surveillance program. It is not intended to diagnose infection nor to guide or monitor treatment. Performed at [facility], [address] [place], [place]       Radiology Studies: DG CHEST PORT 1 VIEW  Result Date: 12/04/2020 CLINICAL DATA:  Follow-up pneumothorax. Status post robotic assisted thorascopic pleurectomy on the right. EXAM: PORTABLE CHEST 1 VIEW COMPARISON:  12/03/2020 and older studies. FINDINGS: Right chest tube is stable, tip at the apex. Minimal right apical pneumothorax is without change from the previous day's exam. Lungs are hyperexpanded with a minor  area of peripheral atelectasis or scarring in the right mid lung, otherwise clear. IMPRESSION: 1. No change from the previous day's exam. 2. Minimal residual right apical pneumothorax.  Stable chest tube. Electronically Signed   By: Amie Portland M.D.   On:  12/04/2020 07:48   DG CHEST PORT 1 VIEW  Result Date: 12/03/2020 CLINICAL DATA:  Shortness of breath, chest pain. EXAM: PORTABLE CHEST 1 VIEW COMPARISON:  December 02, 2020. FINDINGS: The heart size and mediastinal contours are within normal limits. Stable right-sided chest tube. Minimal right apical pneumothorax is noted. Lungs are clear. Old bilateral rib fractures are noted. IMPRESSION: Stable right-sided chest tube.  Minimal right apical pneumothorax. Electronically Signed   By: Lupita Raider M.D.   On: 12/03/2020 08:57    Scheduled Meds:  acetaminophen  1,000 mg Oral Q6H   Or   acetaminophen (TYLENOL) oral liquid 160 mg/5 mL  1,000 mg Oral Q6H   bisacodyl  10 mg Oral Daily   darunavir-cobicistat  1 tablet Oral Q lunch   [START ON 12/05/2020] lamiVUDine  300 mg Oral Daily   And   [START ON 12/05/2020] dolutegravir  50 mg Oral Daily   dolutegravir  50 mg Oral Once   enoxaparin (LOVENOX) injection  40 mg Subcutaneous Daily   guaiFENesin  1,200 mg Oral BID   insulin aspart  0-24 Units Subcutaneous TID AC & HS   senna-docusate  1 tablet Oral QHS   Continuous Infusions:     LOS: 9 days   Rickey Barbara, MD [facility] Pager On Amion  If 7PM-7AM, please contact night-coverage 12/04/2020, 2:25 PM

## 2020-12-04 NOTE — Progress Notes (Addendum)
      [address]       [place] [address]             [phone]      4 Days Post-Op Procedure(s) (LRB): XI ROBOTIC ASSISTED THORASCOPY, WEDGE RESECTION, MECHANICAL PLEURADESIS; APICAL PLEURECTOMY (Right) INTERCOSTAL NERVE BLOCK (Right)  Subjective:  Patient had a rough night.  He developed severe pain in his right scapular area.  He has had this during his stay off and on.  Relieved with pain medication.  He denies pain currently.  Objective: Vital signs in last 24 hours: Temp:  [97.5 F (36.4 C)-98.8 F (37.1 C)] 98.8 F (37.1 C) (07/04 0400) Pulse Rate:  [71-93] 71 (07/04 0400) Cardiac Rhythm: Normal sinus rhythm (07/03 1930) Resp:  [17-23] 20 (07/04 0400) BP: (123-160)/(62-97) 125/76 (07/04 0400) SpO2:  [98 %-100 %] 100 % (07/04 0400)  Intake/Output from previous day: 07/03 0701 - 07/04 0700 In: 320 [P.O.:320] Out: 1350 [Urine:1350] Intake/Output this shift: No intake/output data recorded.  General appearance: alert, cooperative, and no distress Heart: regular rate and rhythm Lungs: clear to auscultation bilaterally Wound: clean, serous drainage from around chest tube  Lab Results: Recent Labs    12/02/20 0122  WBC 9.7  HGB 10.5*  HCT 30.5*  PLT 225   BMET:  Recent Labs    12/02/20 0122 12/03/20 0118  NA 129* 132*  K 3.8 4.3  CL 97* 102  CO2 25 23  GLUCOSE 86 76  BUN 28* 19  CREATININE 1.32* 1.04  CALCIUM 8.6* 8.5*    PT/INR: No results for input(s): LABPROT, INR in the last 72 hours. ABG    Component Value Date/Time   PHART 7.388 12/01/2020 0430   HCO3 23.6 12/01/2020 0430   TCO2 26 11/30/2020 1601   ACIDBASEDEF 0.7 12/01/2020 0430   O2SAT 95.3 12/01/2020 0430   CBG (last 3)  Recent Labs    12/03/20 1600 12/03/20 2124 12/04/20 0636  GLUCAP 95 165* 95    Assessment/Plan: S/P Procedure(s) (LRB): XI ROBOTIC ASSISTED THORASCOPY, WEDGE RESECTION, MECHANICAL PLEURADESIS; APICAL PLEURECTOMY (Right) INTERCOSTAL  NERVE BLOCK (Right)  Chest tube in place on water seal, no air leak present, no output yesterday, CXR with stable appearance of trace apical pneumothorax, will d/c chest tube today if okay with Dr. Mady Gemma- patient stable, CXR with stable trace apical pneumothorax, no air leak present, likely d/c chest tube today, will confirm with Dr. Cliffton Asters, care per primary   LOS: 9 days    Lowella Dandy, PA-C 12/04/2020   Agree with above. Will remove chest tube today.  Aquanetta Schwarz Keane Scrape

## 2020-12-05 ENCOUNTER — Inpatient Hospital Stay (HOSPITAL_COMMUNITY): Payer: Medicare PPO

## 2020-12-05 ENCOUNTER — Other Ambulatory Visit (HOSPITAL_COMMUNITY): Payer: Self-pay

## 2020-12-05 LAB — GLUCOSE, CAPILLARY
Glucose-Capillary: 102 mg/dL — ABNORMAL HIGH (ref 70–99)
Glucose-Capillary: 176 mg/dL — ABNORMAL HIGH (ref 70–99)

## 2020-12-05 MED ORDER — TRAMADOL HCL 50 MG PO TABS
50.0000 mg | ORAL_TABLET | Freq: Four times a day (QID) | ORAL | 0 refills | Status: AC | PRN
  Filled 2020-12-05: qty 5, 1d supply, fill #0

## 2020-12-05 NOTE — Progress Notes (Addendum)
      [address]       [place] [address]             [phone]      5 Days Post-Op Procedure(s) (LRB): XI ROBOTIC ASSISTED THORASCOPY, WEDGE RESECTION, MECHANICAL PLEURADESIS; APICAL PLEURECTOMY (Right) INTERCOSTAL NERVE BLOCK (Right) Subjective: No pain this morning, he is glad the chest tube is removed.   Objective: Vital signs in last 24 hours: Temp:  [97.7 F (36.5 C)-98.7 F (37.1 C)] 98.4 F (36.9 C) (07/05 0414) Pulse Rate:  [67-97] 75 (07/05 0414) Cardiac Rhythm: Normal sinus rhythm (07/04 1913) Resp:  [18-20] 20 (07/05 0414) BP: (110-148)/(77-98) 117/84 (07/05 0414) SpO2:  [97 %-100 %] 97 % (07/05 0414)     Intake/Output from previous day: 07/04 0701 - 07/05 0700 In: 1200 [P.O.:1200] Out: 1300 [Urine:1300] Intake/Output this shift: No intake/output data recorded.  General appearance: alert, cooperative, and no distress Heart: regular rate and rhythm, S1, S2 normal, no murmur, click, rub or gallop Lungs: clear to auscultation bilaterally Abdomen: soft, non-tender; bowel sounds normal; no masses,  no organomegaly Extremities: extremities normal, atraumatic, no cyanosis or edema Wound: clean and dry  Lab Results: No results for input(s): WBC, HGB, HCT, PLT in the last 72 hours. BMET:  Recent Labs    12/03/20 0118  NA 132*  K 4.3  CL 102  CO2 23  GLUCOSE 76  BUN 19  CREATININE 1.04  CALCIUM 8.5*    PT/INR: No results for input(s): LABPROT, INR in the last 72 hours. ABG    Component Value Date/Time   PHART 7.388 12/01/2020 0430   HCO3 23.6 12/01/2020 0430   TCO2 26 11/30/2020 1601   ACIDBASEDEF 0.7 12/01/2020 0430   O2SAT 95.3 12/01/2020 0430   CBG (last 3)  Recent Labs    12/04/20 1657 12/04/20 2053 12/05/20 0607  GLUCAP 134* 78 102*    Assessment/Plan: S/P Procedure(s) (LRB): XI ROBOTIC ASSISTED THORASCOPY, WEDGE RESECTION, MECHANICAL PLEURADESIS; APICAL PLEURECTOMY (Right) INTERCOSTAL NERVE BLOCK (Right)  CXR  today showed: Interim removal of right chest tube. Stable small right apical pneumothorax. Postsurgical changes right lung. No focal infiltrate. Cardiomegaly.  No pulmonary venous congestion. Chest tube removed yesterday without issue.  Pain control-Has morphine, Toradol, and tramadol  He is in NSR with a stable BP  Plan: He feels okay this morning, no complaints. Since his chest tube is out and his chest xray is stable, we will sign off. We are available if needed.     LOS: 10 days    Sharlene Dory 12/05/2020  Agree with above Cxr stable Please call with quesitons  Corliss Skains

## 2020-12-05 NOTE — Plan of Care (Signed)

## 2020-12-05 NOTE — Progress Notes (Signed)
Discharge instructions provided to patient. Need for follow-up care in [place] and medications reviewed. All questions answered. IV removed. Patient to be escorted home by his son.   Allegra Grana RN

## 2020-12-05 NOTE — Discharge Summary (Signed)
Physician Discharge Summary  Chris Jennings ZOX:[id number] DOB: [birth year] DOA: 11/25/2020  PCP: [facility]  Admit date: 11/25/2020 Discharge date: 12/05/2020  Admitted From: Home Disposition:  Home  Recommendations for Outpatient Follow-up:  Follow up with PCP in 1-2 weeks Follow up with CT Surgery as scheduled  Discharge Condition:Improved CODE STATUS:Full Diet recommendation: diabetic   Brief/Interim Summary: 75 year old with a history of DM, HIV, and PTSD who presented to the ED with the sudden onset of severe shortness of breath.  Evaluation revealed a right-sided tension pneumothorax.  A chest tube was placed in the ED.  Discharge Diagnoses:  Principal Problem:   Tension pneumothorax, spontaneous Active Problems:   HIV (human immunodeficiency virus infection) (HCC)   Diabetes (HCC)  Right spontaneous tension pneumothorax Chest tube placed in ED at time of presentation CT surgery following. Noted to have development of repeat near collapse of lung with tension component noted, s/p VATS 6/30 Chest tube was later removed by CT surgery CTS had since signed off   HIV Continue usual home medications   DM2 CBG trends reviewed Remains stable at this time   BPH Continue usual Proscar  Remains stable at this time   ARF -noted to be improved with IVF   Hyponatremia -improved with IVF hydration    Discharge Instructions   Allergies as of 12/05/2020       Reactions   Niaspan [niacin]    Other reaction(s): Liver enzymes abnormal   Penicillin G Rash   Zocor [simvastatin] Rash   Other reaction(s): Liver enzymes abnormal        Medication List     TAKE these medications    alendronate 70 MG tablet Commonly known as: FOSAMAX Take 70 mg by mouth every Sunday.   aspirin EC 81 MG tablet Take 81 mg by mouth daily. Swallow whole.   Calcium Carb-Cholecalciferol 600-400 MG-UNIT Tabs Take 1 tablet by mouth 2 (two) times daily.   darunavir-cobicistat  800-150 MG tablet Commonly known as: PREZCOBIX Take 1 tablet by mouth daily.   Dolutegravir-lamiVUDine 50-300 MG Tabs Take 1 tablet by mouth daily.   ferrous sulfate 325 (65 FE) MG tablet Take 325 mg by mouth in the morning and at bedtime.   finasteride 5 MG tablet Commonly known as: PROSCAR Take 5 mg by mouth at bedtime.   ibuprofen 200 MG tablet Commonly known as: ADVIL Take 600-800 mg by mouth every 6 (six) hours as needed for headache or mild pain.   metFORMIN 500 MG tablet Commonly known as: GLUCOPHAGE Take 500 mg by mouth in the morning and at bedtime.   mirtazapine 7.5 MG tablet Commonly known as: REMERON Take 7.5 mg by mouth at bedtime.   traMADol 50 MG tablet Commonly known as: ULTRAM Take 1-2 tablets (50-100 mg total) by mouth every 6 (six) hours as needed (mild pain).        Follow-up Information     Lightfoot, Eliezer Lofts, MD Follow up.   Specialty: Cardiothoracic Surgery Why: Your routine follow-up appointment is on 7/13 at 3:30pm Contact information: [address] [place] [address] [phone]         [facility]. Schedule an appointment as soon as possible for a visit in 1 week(s).   Specialty: General Practice Why: Hospital follow up Contact information: [address] [place] [address] [phone]                Allergies  Allergen Reactions   Niaspan [Niacin]  Other reaction(s): Liver enzymes abnormal   Penicillin G Rash   Zocor [Simvastatin] Rash    Other reaction(s): Liver enzymes abnormal    Consultations: CT Surgery  Procedures/Studies: DG Chest 2 View  Result Date: 11/25/2020 CLINICAL DATA:  Shortness of breath.  Chest pain. EXAM: CHEST - 2 VIEW COMPARISON:  None. FINDINGS: There is a moderate to large right-sided pneumothorax. The pneumothorax measures at least 8.4 cm at the base and 4.8 cm at the apex. There is fluid in the right pleural space as well consistent with a  hydropneumothorax. The relatively collapsed right lung is not otherwise well assessed. There is some shifting of the mediastinum to the left consistent with a tension pneumothora on the right. No pneumothorax on the left. No infiltrate on the left. No other acute abnormalities. Anterior wedging of a 2 lower thoracic vertebral bodies is age indeterminate. IMPRESSION: 1. Right-sided tension pneumothorax. Fluid in the right pleural space. 2. Age-indeterminate anterior wedging of 2 lower thoracic vertebral bodies. Findings called to Dr. Donnald Garre. Electronically Signed   By: Gerome Sam III M.D   On: 11/25/2020 17:14   CT CHEST WO CONTRAST  Result Date: 11/30/2020 CLINICAL DATA:  Pneumothorax, status post chest tube placement, evaluate for blebs EXAM: CT CHEST WITHOUT CONTRAST TECHNIQUE: Multidetector CT imaging of the chest was performed following the standard protocol without IV contrast. COMPARISON:  None. FINDINGS: Cardiovascular: Aortic atherosclerosis. Dense aortic valve calcifications. Normal heart size. Three-vessel coronary artery calcifications no pericardial effusion. Mediastinum/Nodes: No enlarged mediastinal, hilar, or axillary lymph nodes. Thyroid gland, trachea, and esophagus demonstrate no significant findings. Lungs/Pleura: Small to moderate right hydropneumothorax with pigtail chest tube about the lateral right pleural space. There are small biapical blebs, more numerous on the right (series 4, image 31). Scattered tiny centrilobular nodules, most concentrated in the lung apices. Diffuse bilateral bronchial wall thickening. Upper Abdomen: No acute abnormality. Coarse, nodular contour of the liver in the included upper abdomen. Musculoskeletal: No chest wall mass or suspicious bone lesions identified. Age indeterminate superior endplate deformities of T12 and L1 (series 7, image 78). IMPRESSION: 1. Small to moderate right hydropneumothorax with pigtail chest tube about the lateral right pleural  space. 2. There are small biapical blebs, more numerous on the right. 3. Dense aortic valve calcifications. Correlate for echocardiographic evidence of aortic valve dysfunction. 4. Coronary artery disease. 5. Coarse, nodular contour of the liver in the included upper abdomen, suggestive of cirrhosis. Correlate with biochemical findings. 6. Age indeterminate superior endplate deformities of T12 and L1. Correlate for referable pain and point tenderness. Aortic Atherosclerosis (ICD10-I70.0). Electronically Signed   By: Lauralyn Primes M.D.   On: 11/30/2020 09:27   DG CHEST PORT 1 VIEW  Result Date: 12/05/2020 CLINICAL DATA:  Shortness of breath.  Pneumothorax. EXAM: PORTABLE CHEST 1 VIEW COMPARISON:  12/04/2020. FINDINGS: Interim removal of right chest tube. Stable small right apical pneumothorax. Postsurgical changes right lung. No focal infiltrate. No pleural effusion or pneumothorax. Cardiomegaly. No pulmonary venous congestion. Old left rib fractures. IMPRESSION: 1. Interim removal of right chest tube. Stable small right apical pneumothorax. Postsurgical changes right lung. No focal infiltrate. 2.  Cardiomegaly.  No pulmonary venous congestion. Electronically Signed   By: Maisie Fus  Register   On: 12/05/2020 06:22   DG CHEST PORT 1 VIEW  Result Date: 12/04/2020 CLINICAL DATA:  Follow-up pneumothorax. Status post robotic assisted thorascopic pleurectomy on the right. EXAM: PORTABLE CHEST 1 VIEW COMPARISON:  12/03/2020 and older studies. FINDINGS: Right chest tube is stable, tip  at the apex. Minimal right apical pneumothorax is without change from the previous day's exam. Lungs are hyperexpanded with a minor area of peripheral atelectasis or scarring in the right mid lung, otherwise clear. IMPRESSION: 1. No change from the previous day's exam. 2. Minimal residual right apical pneumothorax.  Stable chest tube. Electronically Signed   By: Amie Portland M.D.   On: 12/04/2020 07:48   DG CHEST PORT 1 VIEW  Result  Date: 12/03/2020 CLINICAL DATA:  Shortness of breath, chest pain. EXAM: PORTABLE CHEST 1 VIEW COMPARISON:  December 02, 2020. FINDINGS: The heart size and mediastinal contours are within normal limits. Stable right-sided chest tube. Minimal right apical pneumothorax is noted. Lungs are clear. Old bilateral rib fractures are noted. IMPRESSION: Stable right-sided chest tube.  Minimal right apical pneumothorax. Electronically Signed   By: Lupita Raider M.D.   On: 12/03/2020 08:57   DG CHEST PORT 1 VIEW  Result Date: 12/02/2020 CLINICAL DATA:  Right pneumothorax.  Evaluate chest tube. EXAM: PORTABLE CHEST 1 VIEW COMPARISON:  12/01/2020 FINDINGS: Right chest tube is in stable position near the right lung apex. No evidence for pneumothorax. Left lung remains clear. Heart and mediastinum are within normal limits. The trachea is midline. Old left rib fractures. Stable densities along the periphery of the right mid chest. IMPRESSION: 1. Stable position of the right chest tube without a pneumothorax. 2. No acute findings. Electronically Signed   By: Richarda Overlie M.D.   On: 12/02/2020 09:16   DG Chest Port 1 View  Result Date: 12/01/2020 CLINICAL DATA:  Chest tube placement EXAM: PORTABLE CHEST 1 VIEW COMPARISON:  11/30/2020 FINDINGS: Right large bore chest tube is unchanged with its tip at the right apex. Mild parenchymal scarring within the right mid lung zone secondary to wedge resection. Right basilar pulmonary infiltrate has resolved. No pneumothorax or pleural effusion. Left lung is clear. Thoracic aorta is tortuous, but unchanged. Cardiac size within normal limits. Pulmonary vascularity is normal. Multiple healed left rib fractures are identified. IMPRESSION: Right chest tube in place.  No pneumothorax. Resolved right basilar pulmonary infiltrate. Status post right lung wedge resection. Electronically Signed   By: Helyn Numbers MD   On: 12/01/2020 06:39   DG Chest Port 1 View  Result Date: 11/30/2020 CLINICAL DATA:   Postprocedure EXAM: PORTABLE CHEST 1 VIEW COMPARISON:  Radiograph 11/29/2020, 11/30/2020 FINDINGS: Removal of a previously seen pigtail catheter drain and interval placement of and right apically directed large-bore chest tube with multiple lucent side ports. Re-expansion of the right lung with some residual peripheral pneumothorax. Surgical material towards the right hilum and periphery of the right lung compatible with wedge resections of the upper and lower lobes. Some patchy opacity in the right lung base. Left lung is clear. Cardiomediastinal contours are unremarkable. No acute osseous or soft tissue abnormality. Remote left-sided rib fractures are unchanged from prior. IMPRESSION: Trace residual lateral pneumothorax despite the presence of a large bore apically directed right chest tube. Postsurgical changes in the right lung compatible with recent wedge resections. Nonspecific patchy opacity in the right lung base, possibly postoperative, atelectasis, airspace disease versus aspiration. Electronically Signed   By: Kreg Shropshire M.D.   On: 11/30/2020 19:08   DG CHEST PORT 1 VIEW  Result Date: 11/30/2020 CLINICAL DATA:  Pneumothorax EXAM: PORTABLE CHEST 1 VIEW COMPARISON:  11/29/2020 FINDINGS: Right pigtail chest tube overlies the lateral mid right pleural space. Right pneumothorax, however, has enlarged with near complete collapse of the right lung and  some degree of tension with mediastinal shift to the left and depression of the right hemidiaphragm. Left lung is clear. No pneumothorax or pleural effusion on the left. Cardiac size within normal limits. Pulmonary vascularity is normal. Multiple healed bilateral rib fractures are noted. No acute bone abnormality. IMPRESSION: Interval development of a large right pneumothorax with tension physiology and near complete collapse of the right lung. Right pigtail chest tube remains in place and clinical correlation for appropriate function of the catheter is  recommended. These results will be called to the ordering clinician or representative by the Radiologist Assistant, and communication documented in the PACS or [employer]. Electronically Signed   By: Helyn Numbers MD   On: 11/30/2020 05:32   DG Chest Port 1 View  Result Date: 11/29/2020 CLINICAL DATA:  Pneumothorax.  Chest tube. EXAM: PORTABLE CHEST 1 VIEW COMPARISON:  11/28/2020. FINDINGS: Right chest tube in stable position. Stable right apical pneumothorax. A small right basilar component also noted. No focal infiltrate. Heart size stable. Old left rib fractures again noted. IMPRESSION: Right chest tube in stable position. Stable small right apical pneumothorax. Small right basilar pneumothorax component also noted. Electronically Signed   By: Maisie Fus  Register   On: 11/29/2020 05:36   DG Chest Port 1 View  Result Date: 11/28/2020 CLINICAL DATA:  Pneumothorax.  Chest tube. EXAM: PORTABLE CHEST 1 VIEW COMPARISON:  11/27/2020. FINDINGS: Right chest tube in stable position. Stable right apical pneumothorax. Heart size stable. Mild right base atelectasis again noted. Old left rib fractures again noted. No acute bony abnormality identified. IMPRESSION: Right chest tube in stable position. Stable right apical pneumothorax. 2.  Mild right base atelectasis again noted. Electronically Signed   By: Maisie Fus  Register   On: 11/28/2020 06:33   DG Chest Port 1 View  Result Date: 11/27/2020 CLINICAL DATA:  Shortness of breath. EXAM: PORTABLE CHEST 1 VIEW COMPARISON:  November 26, 2020. FINDINGS: The heart size and mediastinal contours are within normal limits. Stable position of right-sided chest tube with stable small right apical pneumothorax. Right basilar atelectasis is noted. Left lung is clear. The visualized skeletal structures are unremarkable. IMPRESSION: Stable right-sided chest tube with stable right apical pneumothorax. Electronically Signed   By: Lupita Raider M.D.   On: 11/27/2020 12:16   DG CHEST  PORT 1 VIEW  Result Date: 11/26/2020 CLINICAL DATA:  Follow-up right pneumothorax EXAM: PORTABLE CHEST 1 VIEW COMPARISON:  11/25/2020 FINDINGS: Unchanged small, approximately 15% right apical pneumothorax with right-sided pigtail chest tube in position. The left lung is normally aerated. Heart and mediastinum are unremarkable. IMPRESSION: Unchanged small, approximately 15% right apical pneumothorax with right-sided pigtail chest tube in position. Electronically Signed   By: Lauralyn Primes M.D.   On: 11/26/2020 18:21   DG Chest Portable 1 View  Result Date: 11/25/2020 CLINICAL DATA:  Status post chest tube placement. EXAM: PORTABLE CHEST 1 VIEW COMPARISON:  November 25, 2020 FINDINGS: A right-sided chest tube is seen with its distal end overlying the lateral aspect of the mid right lung. A small residual right apical pneumothorax is seen. A small right pleural effusion is also noted. Mild right infrahilar atelectasis and/or infiltrate is present. The heart size and mediastinal contours are within normal limits. Multiple chronic left-sided rib fractures are seen. IMPRESSION: Interval right-sided chest tube placement and positioning, as described above, with a small residual right apical pneumothorax. Electronically Signed   By: Aram Candela M.D.   On: 11/25/2020 22:29    Subjective: Very eager  to go home  Discharge Exam: Vitals:   12/05/20 0850 12/05/20 1150  BP: 116/69 (!) 135/57  Pulse: 76 82  Resp: 20 18  Temp: 98.1 F (36.7 C) 98.1 F (36.7 C)  SpO2: 100% 100%   Vitals:   12/04/20 2342 12/05/20 0414 12/05/20 0850 12/05/20 1150  BP: 140/77 117/84 116/69 (!) 135/57  Pulse: 67 75 76 82  Resp: 18 20 20 18   Temp: 98.7 F (37.1 C) 98.4 F (36.9 C) 98.1 F (36.7 C) 98.1 F (36.7 C)  TempSrc: Oral Axillary Oral Oral  SpO2: 100% 97% 100% 100%  Weight:      Height:        General: Pt is alert, awake, not in acute distress Cardiovascular: RRR, S1/S2 + Respiratory: CTA bilaterally, no  wheezing, no rhonchi Abdominal: Soft, NT, ND, bowel sounds + Extremities: no edema, no cyanosis   The results of significant diagnostics from this hospitalization (including imaging, microbiology, ancillary and laboratory) are listed below for reference.     Microbiology: Recent Results (from the past 240 hour(s))  SARS CORONAVIRUS 2 (TAT 6-24 HRS) Nasopharyngeal Nasopharyngeal Swab     Status: None   Collection Time: 11/25/20  7:06 PM   Specimen: Nasopharyngeal Swab  Result Value Ref Range Status   SARS Coronavirus 2 NEGATIVE NEGATIVE Final    Comment: (NOTE) SARS-CoV-2 target nucleic acids are NOT DETECTED.  The SARS-CoV-2 RNA is generally detectable in upper and lower respiratory specimens during the acute phase of infection. Negative results do not preclude SARS-CoV-2 infection, do not rule out co-infections with other pathogens, and should not be used as the sole basis for treatment or other patient management decisions. Negative results must be combined with clinical observations, patient history, and epidemiological information. The expected result is Negative.  Fact Sheet for Patients: HairSlick.nohttps://www.fda.gov/media/138098/download  Fact Sheet for Healthcare Providers: quierodirigir.comhttps://www.fda.gov/media/138095/download  This test is not yet approved or cleared by the [place] FDA and  has been authorized for detection and/or diagnosis of SARS-CoV-2 by FDA under an Emergency Use Authorization (EUA). This EUA will remain  in effect (meaning this test can be used) for the duration of the COVID-19 declaration under Se ction 564(b)(1) of the Act, 21 U.S.C. section 360bbb-3(b)(1), unless the authorization is terminated or revoked sooner.  Performed at [facility], [address] [place], [place] [address]   Surgical pcr screen     Status: None   Collection Time: 11/30/20 11:04 AM   Specimen: Nasal Mucosa; Nasal Swab  Result Value Ref Range Status   MRSA, PCR NEGATIVE  NEGATIVE Final   Staphylococcus aureus NEGATIVE NEGATIVE Final    Comment: (NOTE) The Xpert SA Assay (FDA approved for NASAL specimens in patients 75 years of age and older), is one component of a comprehensive surveillance program. It is not intended to diagnose infection nor to guide or monitor treatment. Performed at [facility], [address] [place], [place] [address]      Labs: BNP (last 3 results) No results for input(s): BNP in the last 8760 hours. Basic Metabolic Panel: Recent Labs  Lab 11/30/20 0126 11/30/20 0928 11/30/20 1529 11/30/20 1601 12/01/20 0128 12/02/20 0122 12/03/20 0118  NA 131* 133* 135 135 131* 129* 132*  K 3.8 4.2 3.7 4.1 4.3 3.8 4.3  CL 99 103  --   --  98 97* 102  CO2 23 20*  --   --  26 25 23   GLUCOSE 108* 126*  --   --  190* 86 76  BUN 12 11  --   --  17 28* 19  CREATININE 1.02 0.97  --   --  1.19 1.32* 1.04  CALCIUM 9.3 9.1  --   --  8.7* 8.6* 8.5*   Liver Function Tests: Recent Labs  Lab 11/30/20 0928 12/02/20 0122 12/03/20 0118  AST 24 34 29  ALT ALKPHOS 51 44 37*  BILITOT 0.9 0.7 0.7  PROT 6.9 6.5 6.2*  ALBUMIN 3.0* 3.0* 2.8*   No results for input(s): LIPASE, AMYLASE in the last 168 hours. No results for input(s): AMMONIA in the last 168 hours. CBC: Recent Labs  Lab 11/29/20 0302 11/30/20 0126 11/30/20 1529 11/30/20 1601 12/01/20 0128 12/02/20 0122  WBC 4.6 4.9  --   --  6.3 9.7  HGB 12.4* 12.6* 11.6* 11.6* 11.4* 10.5*  HCT 35.8* 35.8* 34.0* 34.0* 33.9* 30.5*  MCV 86.1 84.6  --   --  87.4 86.4  PLT 155 189  --   --  216 225   Cardiac Enzymes: No results for input(s): CKTOTAL, CKMB, CKMBINDEX, TROPONINI in the last 168 hours. BNP: Invalid input(s): POCBNP CBG: Recent Labs  Lab 12/04/20 1141 12/04/20 1657 12/04/20 2053 12/05/20 0607 12/05/20 1149  GLUCAP 92 134* 78 102* 176*   D-Dimer No results for input(s): DDIMER in the last 72 hours. Hgb A1c No results for input(s): HGBA1C in the  last 72 hours. Lipid Profile No results for input(s): CHOL, HDL, LDLCALC, TRIG, CHOLHDL, LDLDIRECT in the last 72 hours. Thyroid function studies No results for input(s): TSH, T4TOTAL, T3FREE, THYROIDAB in the last 72 hours.  Invalid input(s): FREET3 Anemia work up No results for input(s): VITAMINB12, FOLATE, FERRITIN, TIBC, IRON, RETICCTPCT in the last 72 hours. Urinalysis No results found for: COLORURINE, APPEARANCEUR, LABSPEC, PHURINE, GLUCOSEU, HGBUR, BILIRUBINUR, KETONESUR, PROTEINUR, UROBILINOGEN, NITRITE, LEUKOCYTESUR Sepsis Labs Invalid input(s): PROCALCITONIN,  WBC,  LACTICIDVEN Microbiology Recent Results (from the past 240 hour(s))  SARS CORONAVIRUS 2 (TAT 6-24 HRS) Nasopharyngeal Nasopharyngeal Swab     Status: None   Collection Time: 11/25/20  7:06 PM   Specimen: Nasopharyngeal Swab  Result Value Ref Range Status   SARS Coronavirus 2 NEGATIVE NEGATIVE Final    Comment: (NOTE) SARS-CoV-2 target nucleic acids are NOT DETECTED.  The SARS-CoV-2 RNA is generally detectable in upper and lower respiratory specimens during the acute phase of infection. Negative results do not preclude SARS-CoV-2 infection, do not rule out co-infections with other pathogens, and should not be used as the sole basis for treatment or other patient management decisions. Negative results must be combined with clinical observations, patient history, and epidemiological information. The expected result is Negative.  Fact Sheet for Patients: HairSlick.no  Fact Sheet for Healthcare Providers: quierodirigir.com  This test is not yet approved or cleared by the [place] FDA and  has been authorized for detection and/or diagnosis of SARS-CoV-2 by FDA under an Emergency Use Authorization (EUA). This EUA will remain  in effect (meaning this test can be used) for the duration of the COVID-19 declaration under Se ction 564(b)(1) of the Act, 21  U.S.C. section 360bbb-3(b)(1), unless the authorization is terminated or revoked sooner.  Performed at [facility], [address] [place], [place] [address]   Surgical pcr screen     Status: None   Collection Time: 11/30/20 11:04 AM   Specimen: Nasal Mucosa; Nasal Swab  Result Value Ref Range Status   MRSA, PCR NEGATIVE NEGATIVE Final  Staphylococcus aureus NEGATIVE NEGATIVE Final    Comment: (NOTE) The Xpert SA Assay (FDA approved for NASAL specimens in patients 57 years of age and older), is one component of a comprehensive surveillance program. It is not intended to diagnose infection nor to guide or monitor treatment. Performed at [facility], [address] [place], [place] [address]    Time spent: 30 min  SIGNED:   Rickey Barbara, MD  [facility] 12/05/2020, 12:38 PM  If 7PM-7AM, please contact night-coverage

## 2020-12-05 NOTE — Care Management Important Message (Signed)
Important Message  Patient Details  Name: Chris Jennings MRN: [id number] Date of Birth: 18-May-1946   Medicare Important Message Given:  Yes     Oralia Rud Haillee Johann 12/05/2020, 3:25 PM

## 2020-12-06 LAB — SURGICAL PATHOLOGY

## 2020-12-13 ENCOUNTER — Ambulatory Visit: Payer: Medicare PPO | Admitting: Thoracic Surgery (Cardiothoracic Vascular Surgery)

## 2020-12-18 NOTE — Anesthesia Postprocedure Evaluation (Signed)
Anesthesia Post Note  Patient: Chris Jennings  Procedure(s) Performed: XI ROBOTIC ASSISTED THORASCOPY, WEDGE RESECTION, MECHANICAL PLEURADESIS; APICAL PLEURECTOMY (Right: Chest) INTERCOSTAL NERVE BLOCK (Right: Chest)     Patient location during evaluation: PACU Anesthesia Type: General Level of consciousness: awake and alert Pain management: pain level controlled Vital Signs Assessment: post-procedure vital signs reviewed and stable Respiratory status: spontaneous breathing, nonlabored ventilation, respiratory function stable and patient connected to nasal cannula oxygen Cardiovascular status: blood pressure returned to baseline and stable Postop Assessment: no apparent nausea or vomiting Anesthetic complications: no   No notable events documented.  Last Vitals:  Vitals:   12/05/20 0850 12/05/20 1150  BP: 116/69 (!) 135/57  Pulse: 76 82  Resp: 20 18  Temp: 36.7 C 36.7 C  SpO2: 100% 100%    Last Pain:  Vitals:   12/05/20 1150  TempSrc: Oral  PainSc:                  Kyna Blahnik S

## 2022-04-07 IMAGING — DX DG CHEST 1V PORT
1 series · 1 of 1 positions shown · non-contrast
Comparison: 12/03/2020 and older studies.

CLINICAL DATA: Follow-up pneumothorax. Status post robotic assisted
thorascopic pleurectomy on the right.

EXAM:
PORTABLE CHEST 1 VIEW

[chest]
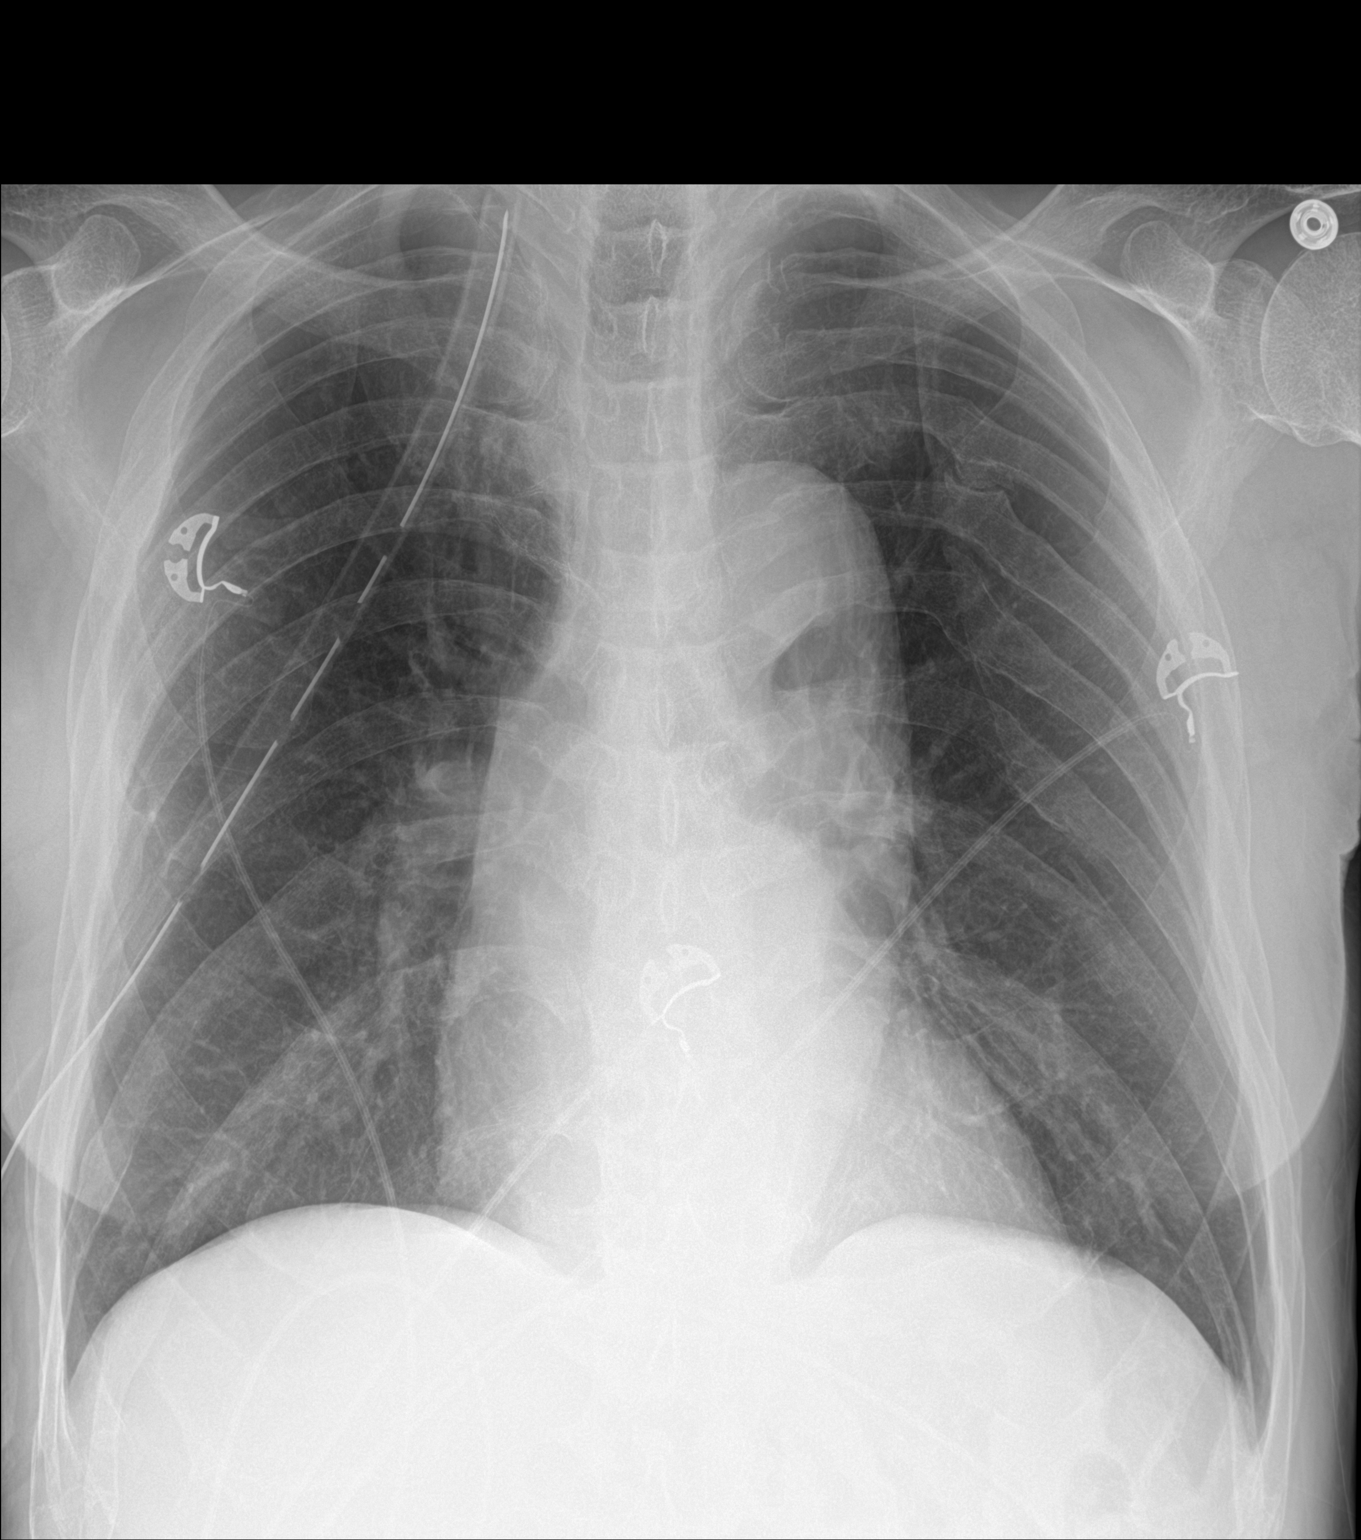

[1 of 1 positions shown; findings below may reference images not displayed]

FINDINGS: Right chest tube is stable, tip at the apex. Minimal right apical
pneumothorax is without change from the previous day's exam.

Lungs are hyperexpanded with a minor area of peripheral atelectasis
or scarring in the right mid lung, otherwise clear.
IMPRESSION: 1. No change from the previous day's exam.
2. Minimal residual right apical pneumothorax.  Stable chest tube.
